# Patient Record
Sex: Female | Born: 1940 | Race: White | Hispanic: No | Marital: Married | Smoking: Never smoker
Health system: Southern US, Community
[De-identification: ages and names within clinical notes are randomized; demographics above are authoritative.]

## PROBLEM LIST (undated history)

## (undated) DIAGNOSIS — M545 Low back pain, unspecified: Secondary | ICD-10-CM

## (undated) DIAGNOSIS — N319 Neuromuscular dysfunction of bladder, unspecified: Secondary | ICD-10-CM

## (undated) DIAGNOSIS — F32A Depression, unspecified: Secondary | ICD-10-CM

## (undated) DIAGNOSIS — G35 Multiple sclerosis: Secondary | ICD-10-CM

## (undated) DIAGNOSIS — F329 Major depressive disorder, single episode, unspecified: Secondary | ICD-10-CM

## (undated) DIAGNOSIS — R269 Unspecified abnormalities of gait and mobility: Secondary | ICD-10-CM

## (undated) DIAGNOSIS — IMO0002 Reserved for concepts with insufficient information to code with codable children: Secondary | ICD-10-CM

## (undated) DIAGNOSIS — M0520 Rheumatoid vasculitis with rheumatoid arthritis of unspecified site: Secondary | ICD-10-CM

## (undated) DIAGNOSIS — F419 Anxiety disorder, unspecified: Secondary | ICD-10-CM

## (undated) DIAGNOSIS — M797 Fibromyalgia: Secondary | ICD-10-CM

## (undated) HISTORY — DX: Unspecified abnormalities of gait and mobility: R26.9

## (undated) HISTORY — DX: Low back pain, unspecified: M54.50

## (undated) HISTORY — DX: Neuromuscular dysfunction of bladder, unspecified: N31.9

## (undated) HISTORY — PX: GALLBLADDER SURGERY: SHX652

## (undated) HISTORY — PX: ABDOMINAL HYSTERECTOMY: SHX81

## (undated) HISTORY — DX: Multiple sclerosis: G35

## (undated) HISTORY — DX: Low back pain: M54.5

## (undated) HISTORY — DX: Anxiety disorder, unspecified: F41.9

## (undated) HISTORY — PX: APPENDECTOMY: SHX54

## (undated) HISTORY — PX: FRACTURE SURGERY: SHX138

## (undated) HISTORY — DX: Major depressive disorder, single episode, unspecified: F32.9

## (undated) HISTORY — PX: VASCULAR SURGERY: SHX849

## (undated) HISTORY — DX: Reserved for concepts with insufficient information to code with codable children: IMO0002

## (undated) HISTORY — DX: Rheumatoid vasculitis with rheumatoid arthritis of unspecified site: M05.20

## (undated) HISTORY — DX: Depression, unspecified: F32.A

## (undated) HISTORY — DX: Fibromyalgia: M79.7

---

## 2005-01-16 ENCOUNTER — Inpatient Hospital Stay (HOSPITAL_COMMUNITY): Admission: AD | Admit: 2005-01-16 | Discharge: 2005-01-17 | Payer: Self-pay | Admitting: Cardiovascular Disease

## 2005-01-16 ENCOUNTER — Ambulatory Visit: Payer: Self-pay | Admitting: Cardiovascular Disease

## 2007-02-23 ENCOUNTER — Ambulatory Visit: Payer: Self-pay | Admitting: Cardiology

## 2011-02-26 DIAGNOSIS — M069 Rheumatoid arthritis, unspecified: Secondary | ICD-10-CM | POA: Diagnosis not present

## 2011-02-26 DIAGNOSIS — M130 Polyarthritis, unspecified: Secondary | ICD-10-CM | POA: Diagnosis not present

## 2011-02-26 DIAGNOSIS — Z79899 Other long term (current) drug therapy: Secondary | ICD-10-CM | POA: Diagnosis not present

## 2011-02-26 DIAGNOSIS — M1710 Unilateral primary osteoarthritis, unspecified knee: Secondary | ICD-10-CM | POA: Diagnosis not present

## 2011-03-05 DIAGNOSIS — Z79899 Other long term (current) drug therapy: Secondary | ICD-10-CM | POA: Diagnosis not present

## 2011-03-05 DIAGNOSIS — M069 Rheumatoid arthritis, unspecified: Secondary | ICD-10-CM | POA: Diagnosis not present

## 2011-03-05 DIAGNOSIS — M130 Polyarthritis, unspecified: Secondary | ICD-10-CM | POA: Diagnosis not present

## 2011-03-05 DIAGNOSIS — IMO0001 Reserved for inherently not codable concepts without codable children: Secondary | ICD-10-CM | POA: Diagnosis not present

## 2011-03-11 DIAGNOSIS — Z7983 Long term (current) use of bisphosphonates: Secondary | ICD-10-CM | POA: Diagnosis not present

## 2011-03-11 DIAGNOSIS — M810 Age-related osteoporosis without current pathological fracture: Secondary | ICD-10-CM | POA: Diagnosis not present

## 2011-03-20 DIAGNOSIS — M25559 Pain in unspecified hip: Secondary | ICD-10-CM | POA: Diagnosis not present

## 2011-03-20 DIAGNOSIS — R0989 Other specified symptoms and signs involving the circulatory and respiratory systems: Secondary | ICD-10-CM | POA: Diagnosis not present

## 2011-03-20 DIAGNOSIS — R05 Cough: Secondary | ICD-10-CM | POA: Diagnosis not present

## 2011-03-20 DIAGNOSIS — J40 Bronchitis, not specified as acute or chronic: Secondary | ICD-10-CM | POA: Diagnosis not present

## 2011-03-20 DIAGNOSIS — J3489 Other specified disorders of nose and nasal sinuses: Secondary | ICD-10-CM | POA: Diagnosis not present

## 2011-04-28 DIAGNOSIS — M069 Rheumatoid arthritis, unspecified: Secondary | ICD-10-CM | POA: Diagnosis not present

## 2011-04-28 DIAGNOSIS — M19049 Primary osteoarthritis, unspecified hand: Secondary | ICD-10-CM | POA: Diagnosis not present

## 2011-04-28 DIAGNOSIS — M1610 Unilateral primary osteoarthritis, unspecified hip: Secondary | ICD-10-CM | POA: Diagnosis not present

## 2011-04-28 DIAGNOSIS — M545 Low back pain: Secondary | ICD-10-CM | POA: Diagnosis not present

## 2011-05-19 DIAGNOSIS — M130 Polyarthritis, unspecified: Secondary | ICD-10-CM | POA: Diagnosis not present

## 2011-05-19 DIAGNOSIS — M2550 Pain in unspecified joint: Secondary | ICD-10-CM | POA: Diagnosis not present

## 2011-05-19 DIAGNOSIS — M2560 Stiffness of unspecified joint, not elsewhere classified: Secondary | ICD-10-CM | POA: Diagnosis not present

## 2011-05-20 DIAGNOSIS — D518 Other vitamin B12 deficiency anemias: Secondary | ICD-10-CM | POA: Insufficient documentation

## 2011-05-20 DIAGNOSIS — G35 Multiple sclerosis: Secondary | ICD-10-CM | POA: Insufficient documentation

## 2011-05-20 DIAGNOSIS — G839 Paralytic syndrome, unspecified: Secondary | ICD-10-CM | POA: Diagnosis not present

## 2011-05-20 DIAGNOSIS — R269 Unspecified abnormalities of gait and mobility: Secondary | ICD-10-CM | POA: Diagnosis not present

## 2011-05-21 DIAGNOSIS — M2550 Pain in unspecified joint: Secondary | ICD-10-CM | POA: Diagnosis not present

## 2011-05-26 DIAGNOSIS — M2550 Pain in unspecified joint: Secondary | ICD-10-CM | POA: Diagnosis not present

## 2011-05-27 DIAGNOSIS — Z79899 Other long term (current) drug therapy: Secondary | ICD-10-CM | POA: Diagnosis not present

## 2011-05-29 DIAGNOSIS — M2550 Pain in unspecified joint: Secondary | ICD-10-CM | POA: Diagnosis not present

## 2011-06-02 DIAGNOSIS — M2550 Pain in unspecified joint: Secondary | ICD-10-CM | POA: Diagnosis not present

## 2011-06-03 DIAGNOSIS — M2550 Pain in unspecified joint: Secondary | ICD-10-CM | POA: Diagnosis not present

## 2011-06-03 DIAGNOSIS — M069 Rheumatoid arthritis, unspecified: Secondary | ICD-10-CM | POA: Diagnosis not present

## 2011-06-03 DIAGNOSIS — IMO0001 Reserved for inherently not codable concepts without codable children: Secondary | ICD-10-CM | POA: Diagnosis not present

## 2011-06-05 DIAGNOSIS — M2550 Pain in unspecified joint: Secondary | ICD-10-CM | POA: Diagnosis not present

## 2011-06-09 DIAGNOSIS — M2550 Pain in unspecified joint: Secondary | ICD-10-CM | POA: Diagnosis not present

## 2011-07-02 DIAGNOSIS — R5381 Other malaise: Secondary | ICD-10-CM | POA: Diagnosis not present

## 2011-07-02 DIAGNOSIS — R300 Dysuria: Secondary | ICD-10-CM | POA: Diagnosis not present

## 2011-07-02 DIAGNOSIS — I10 Essential (primary) hypertension: Secondary | ICD-10-CM | POA: Diagnosis not present

## 2011-07-02 DIAGNOSIS — E782 Mixed hyperlipidemia: Secondary | ICD-10-CM | POA: Diagnosis not present

## 2011-07-09 DIAGNOSIS — E782 Mixed hyperlipidemia: Secondary | ICD-10-CM | POA: Diagnosis not present

## 2011-07-09 DIAGNOSIS — IMO0001 Reserved for inherently not codable concepts without codable children: Secondary | ICD-10-CM | POA: Diagnosis not present

## 2011-07-09 DIAGNOSIS — G4700 Insomnia, unspecified: Secondary | ICD-10-CM | POA: Diagnosis not present

## 2011-07-09 DIAGNOSIS — M810 Age-related osteoporosis without current pathological fracture: Secondary | ICD-10-CM | POA: Diagnosis not present

## 2011-07-09 DIAGNOSIS — M545 Low back pain: Secondary | ICD-10-CM | POA: Diagnosis not present

## 2011-07-09 DIAGNOSIS — M1990 Unspecified osteoarthritis, unspecified site: Secondary | ICD-10-CM | POA: Diagnosis not present

## 2011-07-09 DIAGNOSIS — I10 Essential (primary) hypertension: Secondary | ICD-10-CM | POA: Diagnosis not present

## 2011-07-09 DIAGNOSIS — G35 Multiple sclerosis: Secondary | ICD-10-CM | POA: Diagnosis not present

## 2011-08-11 DIAGNOSIS — H6120 Impacted cerumen, unspecified ear: Secondary | ICD-10-CM | POA: Diagnosis not present

## 2011-08-27 DIAGNOSIS — IMO0001 Reserved for inherently not codable concepts without codable children: Secondary | ICD-10-CM | POA: Diagnosis not present

## 2011-08-27 DIAGNOSIS — M790 Rheumatism, unspecified: Secondary | ICD-10-CM | POA: Diagnosis not present

## 2011-08-27 DIAGNOSIS — I288 Other diseases of pulmonary vessels: Secondary | ICD-10-CM | POA: Diagnosis not present

## 2011-08-27 DIAGNOSIS — D472 Monoclonal gammopathy: Secondary | ICD-10-CM | POA: Diagnosis not present

## 2011-08-27 DIAGNOSIS — M069 Rheumatoid arthritis, unspecified: Secondary | ICD-10-CM | POA: Diagnosis not present

## 2011-09-03 DIAGNOSIS — M069 Rheumatoid arthritis, unspecified: Secondary | ICD-10-CM | POA: Diagnosis not present

## 2011-09-03 DIAGNOSIS — IMO0001 Reserved for inherently not codable concepts without codable children: Secondary | ICD-10-CM | POA: Diagnosis not present

## 2011-09-03 DIAGNOSIS — M2550 Pain in unspecified joint: Secondary | ICD-10-CM | POA: Diagnosis not present

## 2011-09-03 DIAGNOSIS — Z79899 Other long term (current) drug therapy: Secondary | ICD-10-CM | POA: Diagnosis not present

## 2011-10-06 DIAGNOSIS — M545 Low back pain: Secondary | ICD-10-CM | POA: Diagnosis not present

## 2011-10-06 DIAGNOSIS — M1990 Unspecified osteoarthritis, unspecified site: Secondary | ICD-10-CM | POA: Diagnosis not present

## 2011-10-06 DIAGNOSIS — E782 Mixed hyperlipidemia: Secondary | ICD-10-CM | POA: Diagnosis not present

## 2011-10-06 DIAGNOSIS — G4700 Insomnia, unspecified: Secondary | ICD-10-CM | POA: Diagnosis not present

## 2011-10-06 DIAGNOSIS — G35 Multiple sclerosis: Secondary | ICD-10-CM | POA: Diagnosis not present

## 2011-10-06 DIAGNOSIS — IMO0001 Reserved for inherently not codable concepts without codable children: Secondary | ICD-10-CM | POA: Diagnosis not present

## 2011-10-06 DIAGNOSIS — M810 Age-related osteoporosis without current pathological fracture: Secondary | ICD-10-CM | POA: Diagnosis not present

## 2011-10-06 DIAGNOSIS — I10 Essential (primary) hypertension: Secondary | ICD-10-CM | POA: Diagnosis not present

## 2011-11-06 DIAGNOSIS — R894 Abnormal immunological findings in specimens from other organs, systems and tissues: Secondary | ICD-10-CM | POA: Diagnosis not present

## 2011-11-06 DIAGNOSIS — IMO0001 Reserved for inherently not codable concepts without codable children: Secondary | ICD-10-CM | POA: Diagnosis not present

## 2011-11-06 DIAGNOSIS — Z23 Encounter for immunization: Secondary | ICD-10-CM | POA: Diagnosis not present

## 2011-11-06 DIAGNOSIS — G35 Multiple sclerosis: Secondary | ICD-10-CM | POA: Diagnosis not present

## 2011-11-06 DIAGNOSIS — Z79899 Other long term (current) drug therapy: Secondary | ICD-10-CM | POA: Diagnosis not present

## 2011-11-06 DIAGNOSIS — M1990 Unspecified osteoarthritis, unspecified site: Secondary | ICD-10-CM | POA: Diagnosis not present

## 2011-11-06 DIAGNOSIS — D472 Monoclonal gammopathy: Secondary | ICD-10-CM | POA: Diagnosis not present

## 2011-11-21 DIAGNOSIS — IMO0001 Reserved for inherently not codable concepts without codable children: Secondary | ICD-10-CM | POA: Diagnosis not present

## 2011-11-21 DIAGNOSIS — M9981 Other biomechanical lesions of cervical region: Secondary | ICD-10-CM | POA: Diagnosis not present

## 2011-11-21 DIAGNOSIS — M999 Biomechanical lesion, unspecified: Secondary | ICD-10-CM | POA: Diagnosis not present

## 2011-11-25 DIAGNOSIS — G35 Multiple sclerosis: Secondary | ICD-10-CM | POA: Diagnosis not present

## 2011-11-25 DIAGNOSIS — M1990 Unspecified osteoarthritis, unspecified site: Secondary | ICD-10-CM | POA: Diagnosis not present

## 2011-11-25 DIAGNOSIS — G4700 Insomnia, unspecified: Secondary | ICD-10-CM | POA: Diagnosis not present

## 2011-11-25 DIAGNOSIS — M999 Biomechanical lesion, unspecified: Secondary | ICD-10-CM | POA: Diagnosis not present

## 2011-11-25 DIAGNOSIS — M545 Low back pain: Secondary | ICD-10-CM | POA: Diagnosis not present

## 2011-11-25 DIAGNOSIS — I10 Essential (primary) hypertension: Secondary | ICD-10-CM | POA: Diagnosis not present

## 2011-11-25 DIAGNOSIS — E782 Mixed hyperlipidemia: Secondary | ICD-10-CM | POA: Diagnosis not present

## 2011-11-25 DIAGNOSIS — M810 Age-related osteoporosis without current pathological fracture: Secondary | ICD-10-CM | POA: Diagnosis not present

## 2011-11-25 DIAGNOSIS — M9981 Other biomechanical lesions of cervical region: Secondary | ICD-10-CM | POA: Diagnosis not present

## 2011-11-25 DIAGNOSIS — IMO0001 Reserved for inherently not codable concepts without codable children: Secondary | ICD-10-CM | POA: Diagnosis not present

## 2011-11-27 DIAGNOSIS — M9981 Other biomechanical lesions of cervical region: Secondary | ICD-10-CM | POA: Diagnosis not present

## 2011-11-27 DIAGNOSIS — M999 Biomechanical lesion, unspecified: Secondary | ICD-10-CM | POA: Diagnosis not present

## 2011-11-27 DIAGNOSIS — IMO0001 Reserved for inherently not codable concepts without codable children: Secondary | ICD-10-CM | POA: Diagnosis not present

## 2011-11-28 DIAGNOSIS — M999 Biomechanical lesion, unspecified: Secondary | ICD-10-CM | POA: Diagnosis not present

## 2011-11-28 DIAGNOSIS — M9981 Other biomechanical lesions of cervical region: Secondary | ICD-10-CM | POA: Diagnosis not present

## 2011-11-28 DIAGNOSIS — IMO0001 Reserved for inherently not codable concepts without codable children: Secondary | ICD-10-CM | POA: Diagnosis not present

## 2011-12-02 DIAGNOSIS — M9981 Other biomechanical lesions of cervical region: Secondary | ICD-10-CM | POA: Diagnosis not present

## 2011-12-02 DIAGNOSIS — M999 Biomechanical lesion, unspecified: Secondary | ICD-10-CM | POA: Diagnosis not present

## 2011-12-02 DIAGNOSIS — IMO0001 Reserved for inherently not codable concepts without codable children: Secondary | ICD-10-CM | POA: Diagnosis not present

## 2011-12-05 DIAGNOSIS — M9981 Other biomechanical lesions of cervical region: Secondary | ICD-10-CM | POA: Diagnosis not present

## 2011-12-05 DIAGNOSIS — M069 Rheumatoid arthritis, unspecified: Secondary | ICD-10-CM | POA: Diagnosis not present

## 2011-12-05 DIAGNOSIS — IMO0001 Reserved for inherently not codable concepts without codable children: Secondary | ICD-10-CM | POA: Diagnosis not present

## 2011-12-05 DIAGNOSIS — M999 Biomechanical lesion, unspecified: Secondary | ICD-10-CM | POA: Diagnosis not present

## 2011-12-05 DIAGNOSIS — Z79899 Other long term (current) drug therapy: Secondary | ICD-10-CM | POA: Diagnosis not present

## 2011-12-05 DIAGNOSIS — R894 Abnormal immunological findings in specimens from other organs, systems and tissues: Secondary | ICD-10-CM | POA: Diagnosis not present

## 2012-02-12 DIAGNOSIS — M999 Biomechanical lesion, unspecified: Secondary | ICD-10-CM | POA: Diagnosis not present

## 2012-02-12 DIAGNOSIS — M9981 Other biomechanical lesions of cervical region: Secondary | ICD-10-CM | POA: Diagnosis not present

## 2012-02-12 DIAGNOSIS — IMO0001 Reserved for inherently not codable concepts without codable children: Secondary | ICD-10-CM | POA: Diagnosis not present

## 2012-02-16 DIAGNOSIS — M9981 Other biomechanical lesions of cervical region: Secondary | ICD-10-CM | POA: Diagnosis not present

## 2012-02-16 DIAGNOSIS — M999 Biomechanical lesion, unspecified: Secondary | ICD-10-CM | POA: Diagnosis not present

## 2012-02-16 DIAGNOSIS — IMO0001 Reserved for inherently not codable concepts without codable children: Secondary | ICD-10-CM | POA: Diagnosis not present

## 2012-02-20 DIAGNOSIS — M999 Biomechanical lesion, unspecified: Secondary | ICD-10-CM | POA: Diagnosis not present

## 2012-02-20 DIAGNOSIS — IMO0001 Reserved for inherently not codable concepts without codable children: Secondary | ICD-10-CM | POA: Diagnosis not present

## 2012-02-20 DIAGNOSIS — M9981 Other biomechanical lesions of cervical region: Secondary | ICD-10-CM | POA: Diagnosis not present

## 2012-02-23 DIAGNOSIS — M9981 Other biomechanical lesions of cervical region: Secondary | ICD-10-CM | POA: Diagnosis not present

## 2012-02-23 DIAGNOSIS — IMO0001 Reserved for inherently not codable concepts without codable children: Secondary | ICD-10-CM | POA: Diagnosis not present

## 2012-02-23 DIAGNOSIS — M999 Biomechanical lesion, unspecified: Secondary | ICD-10-CM | POA: Diagnosis not present

## 2012-03-01 DIAGNOSIS — H6690 Otitis media, unspecified, unspecified ear: Secondary | ICD-10-CM | POA: Diagnosis not present

## 2012-03-01 DIAGNOSIS — R51 Headache: Secondary | ICD-10-CM | POA: Diagnosis not present

## 2012-03-08 DIAGNOSIS — Z79899 Other long term (current) drug therapy: Secondary | ICD-10-CM | POA: Diagnosis not present

## 2012-03-08 DIAGNOSIS — M069 Rheumatoid arthritis, unspecified: Secondary | ICD-10-CM | POA: Diagnosis not present

## 2012-03-08 DIAGNOSIS — M159 Polyosteoarthritis, unspecified: Secondary | ICD-10-CM | POA: Diagnosis not present

## 2012-03-08 DIAGNOSIS — IMO0001 Reserved for inherently not codable concepts without codable children: Secondary | ICD-10-CM | POA: Diagnosis not present

## 2012-03-16 DIAGNOSIS — E7800 Pure hypercholesterolemia, unspecified: Secondary | ICD-10-CM | POA: Diagnosis not present

## 2012-03-16 DIAGNOSIS — IMO0001 Reserved for inherently not codable concepts without codable children: Secondary | ICD-10-CM | POA: Diagnosis not present

## 2012-03-16 DIAGNOSIS — I10 Essential (primary) hypertension: Secondary | ICD-10-CM | POA: Diagnosis not present

## 2012-03-16 DIAGNOSIS — Z79899 Other long term (current) drug therapy: Secondary | ICD-10-CM | POA: Diagnosis not present

## 2012-03-16 DIAGNOSIS — E039 Hypothyroidism, unspecified: Secondary | ICD-10-CM | POA: Diagnosis not present

## 2012-03-16 DIAGNOSIS — R5381 Other malaise: Secondary | ICD-10-CM | POA: Diagnosis not present

## 2012-03-16 DIAGNOSIS — R5383 Other fatigue: Secondary | ICD-10-CM | POA: Diagnosis not present

## 2012-03-16 DIAGNOSIS — E559 Vitamin D deficiency, unspecified: Secondary | ICD-10-CM | POA: Diagnosis not present

## 2012-03-16 DIAGNOSIS — M069 Rheumatoid arthritis, unspecified: Secondary | ICD-10-CM | POA: Diagnosis not present

## 2012-03-16 DIAGNOSIS — G35 Multiple sclerosis: Secondary | ICD-10-CM | POA: Diagnosis not present

## 2012-03-16 DIAGNOSIS — E782 Mixed hyperlipidemia: Secondary | ICD-10-CM | POA: Diagnosis not present

## 2012-03-22 DIAGNOSIS — I10 Essential (primary) hypertension: Secondary | ICD-10-CM | POA: Diagnosis not present

## 2012-03-22 DIAGNOSIS — H04129 Dry eye syndrome of unspecified lacrimal gland: Secondary | ICD-10-CM | POA: Diagnosis not present

## 2012-03-22 DIAGNOSIS — H2510 Age-related nuclear cataract, unspecified eye: Secondary | ICD-10-CM | POA: Diagnosis not present

## 2012-03-22 DIAGNOSIS — Z79899 Other long term (current) drug therapy: Secondary | ICD-10-CM | POA: Diagnosis not present

## 2012-03-26 ENCOUNTER — Encounter: Payer: Self-pay | Admitting: Neurology

## 2012-03-26 DIAGNOSIS — D518 Other vitamin B12 deficiency anemias: Secondary | ICD-10-CM

## 2012-03-26 DIAGNOSIS — G35D Multiple sclerosis, unspecified: Secondary | ICD-10-CM

## 2012-03-26 DIAGNOSIS — G35 Multiple sclerosis: Secondary | ICD-10-CM

## 2012-03-26 DIAGNOSIS — R269 Unspecified abnormalities of gait and mobility: Secondary | ICD-10-CM

## 2012-03-26 DIAGNOSIS — G839 Paralytic syndrome, unspecified: Secondary | ICD-10-CM

## 2012-04-08 DIAGNOSIS — Z79899 Other long term (current) drug therapy: Secondary | ICD-10-CM | POA: Diagnosis not present

## 2012-04-08 DIAGNOSIS — R12 Heartburn: Secondary | ICD-10-CM | POA: Diagnosis not present

## 2012-04-08 DIAGNOSIS — K5900 Constipation, unspecified: Secondary | ICD-10-CM | POA: Diagnosis not present

## 2012-04-12 DIAGNOSIS — Z882 Allergy status to sulfonamides status: Secondary | ICD-10-CM | POA: Diagnosis not present

## 2012-04-12 DIAGNOSIS — M069 Rheumatoid arthritis, unspecified: Secondary | ICD-10-CM | POA: Diagnosis not present

## 2012-04-12 DIAGNOSIS — Z801 Family history of malignant neoplasm of trachea, bronchus and lung: Secondary | ICD-10-CM | POA: Diagnosis not present

## 2012-04-12 DIAGNOSIS — I10 Essential (primary) hypertension: Secondary | ICD-10-CM | POA: Diagnosis not present

## 2012-04-12 DIAGNOSIS — R12 Heartburn: Secondary | ICD-10-CM | POA: Diagnosis not present

## 2012-04-12 DIAGNOSIS — F329 Major depressive disorder, single episode, unspecified: Secondary | ICD-10-CM | POA: Diagnosis not present

## 2012-04-12 DIAGNOSIS — IMO0001 Reserved for inherently not codable concepts without codable children: Secondary | ICD-10-CM | POA: Diagnosis not present

## 2012-04-12 DIAGNOSIS — Z79899 Other long term (current) drug therapy: Secondary | ICD-10-CM | POA: Diagnosis not present

## 2012-04-12 DIAGNOSIS — K219 Gastro-esophageal reflux disease without esophagitis: Secondary | ICD-10-CM | POA: Diagnosis not present

## 2012-04-12 DIAGNOSIS — Z885 Allergy status to narcotic agent status: Secondary | ICD-10-CM | POA: Diagnosis not present

## 2012-04-12 DIAGNOSIS — G35 Multiple sclerosis: Secondary | ICD-10-CM | POA: Diagnosis not present

## 2012-04-12 DIAGNOSIS — Z8249 Family history of ischemic heart disease and other diseases of the circulatory system: Secondary | ICD-10-CM | POA: Diagnosis not present

## 2012-04-12 DIAGNOSIS — M1990 Unspecified osteoarthritis, unspecified site: Secondary | ICD-10-CM | POA: Diagnosis not present

## 2012-04-12 DIAGNOSIS — E785 Hyperlipidemia, unspecified: Secondary | ICD-10-CM | POA: Diagnosis not present

## 2012-04-12 DIAGNOSIS — Z800 Family history of malignant neoplasm of digestive organs: Secondary | ICD-10-CM | POA: Diagnosis not present

## 2012-04-12 DIAGNOSIS — K5900 Constipation, unspecified: Secondary | ICD-10-CM | POA: Diagnosis not present

## 2012-04-21 DIAGNOSIS — Z1231 Encounter for screening mammogram for malignant neoplasm of breast: Secondary | ICD-10-CM | POA: Diagnosis not present

## 2012-04-21 DIAGNOSIS — Z01419 Encounter for gynecological examination (general) (routine) without abnormal findings: Secondary | ICD-10-CM | POA: Diagnosis not present

## 2012-04-21 DIAGNOSIS — M818 Other osteoporosis without current pathological fracture: Secondary | ICD-10-CM | POA: Diagnosis not present

## 2012-04-22 DIAGNOSIS — G35 Multiple sclerosis: Secondary | ICD-10-CM | POA: Diagnosis not present

## 2012-05-05 ENCOUNTER — Telehealth: Payer: Self-pay | Admitting: Neurology

## 2012-05-05 MED ORDER — PREDNISONE 10 MG PO TABS: ORAL_TABLET | ORAL | Status: DC

## 2012-05-05 NOTE — Telephone Encounter (Signed)
I called patient. She has last been seen through this office about one year ago. The patient indicates that she has a chronic gait disorder, but within the last 2 days, her walking has significantly worsened. The patient has a difficult to ambulate. The patient denies any falls within the last 2 days. The bladder has become more frequent, but she denies any change in bowel function. The patient has some numbness below the knees bilaterally, and she believes that the left leg is worse in the right. I will get her started on a prednisone Dosepak. This patient may need physical therapy in the next week or 2 if the symptoms do not improve. We will try to get a work in revisit within the next one or 2 weeks.

## 2012-05-05 NOTE — Telephone Encounter (Signed)
Patient left message that she is unable to lift her feet, it began about 2 days ago.  She said it's getting worse and she is unable to use her walker.  She would like to come in and see doctor or "another associate,"  She would like some advice.  Her number is [phone].

## 2012-05-06 ENCOUNTER — Telehealth: Payer: Self-pay

## 2012-05-06 NOTE — Telephone Encounter (Signed)
I called the pharmacy. The prescription was not written properly, the patient was to start on 6 tablets a day, tapering by one tablet every 2 days, Basically a 10 mg 12 day prednisone Dosepak.

## 2012-05-06 NOTE — Telephone Encounter (Signed)
Pharmacy sent [place] a fax stating they need clarification on the Prednisone directions.  They may be phoned at [phone] or faxed at [phone].  Thank you.

## 2012-05-07 ENCOUNTER — Ambulatory Visit: Payer: Self-pay | Admitting: Neurology

## 2012-05-19 ENCOUNTER — Encounter: Payer: Self-pay | Admitting: Neurology

## 2012-05-19 ENCOUNTER — Ambulatory Visit (INDEPENDENT_AMBULATORY_CARE_PROVIDER_SITE_OTHER): Payer: Medicare Other | Admitting: Neurology

## 2012-05-19 VITALS — BP 144/77 | HR 70

## 2012-05-19 DIAGNOSIS — R269 Unspecified abnormalities of gait and mobility: Secondary | ICD-10-CM

## 2012-05-19 DIAGNOSIS — G35 Multiple sclerosis: Secondary | ICD-10-CM | POA: Diagnosis not present

## 2012-05-19 DIAGNOSIS — G35D Multiple sclerosis, unspecified: Secondary | ICD-10-CM

## 2012-05-19 MED ORDER — PREDNISONE 5 MG PO TABS: ORAL_TABLET | ORAL | Status: DC

## 2012-05-19 MED ORDER — OMEPRAZOLE 20 MG PO CPDR: 20.0000 mg | DELAYED_RELEASE_CAPSULE | Freq: Every day | ORAL | Status: AC

## 2012-05-19 NOTE — Progress Notes (Signed)
Reason for visit: Multiple sclerosis  Stephanie Perez is an 72 y.o. female  History of present illness:  Stephanie Perez is a 5 year old right-handed white female with a history of multiple sclerosis. 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 The patient however, at the end of March of 2014 has noted a sudden increase in fatigue, difficulty with walking, increase in urinary frequency, and stumbling and falling at home. The patient uses a walker for ambulation. The fatigue has become overwhelming, and she has developed some discomfort into the legs, primarily the thighs bilaterally. The patient last had MRI evaluation of the brain in 2012. The patient returns for an evaluation. The patient has a history of rheumatoid arthritis, and she is on Plaquenil for this. The patient was set up for a trial on Ampyra one year ago, but she never went on the medication.  Past Medical History  Diagnosis Date  . Multiple sclerosis   . Multiple sclerosis   . Spastic paraparesis   . Depression   . Anxiety   . Fibromyalgia   . Rheumatoid arteritis   . Neurogenic bladder     Past Surgical History  Procedure Laterality Date  . Abdominal hysterectomy    . Appendectomy    . Gallbladder surgery N/A   . Fracture surgery      Family History  Problem Relation Age of Onset  . Lung cancer Mother   . Heart disease Father   . Other Father   . Multiple sclerosis Brother     Social history:  reports that she has never smoked. She does not have any smokeless tobacco history on file. She reports that  drinks alcohol. She reports that she does not use illicit drugs.  Allergies:  Allergies  Allergen Reactions  . Bactrim (Sulfamethoxazole W-Trimethoprim)   . Codeine Sulfate   . Lyrica (Pregabalin)   . Zanaflex (Tizanidine Hcl)     Medications:  Current Outpatient Prescriptions on File Prior to Visit  Medication Sig  Dispense Refill  . aspirin 81 MG tablet Take 81 mg by mouth daily.      . clonazePAM (KLONOPIN) 0.5 MG tablet Take 0.5 mg by mouth at bedtime as needed for anxiety.      . diazepam (VALIUM) 2 MG tablet Take 2 mg by mouth 1 day or 1 dose.      . docusate sodium (COLACE) 100 MG capsule Take 100 mg by mouth 3 (three) times daily as needed for constipation.      . DULoxetine (CYMBALTA) 60 MG capsule Take 60 mg by mouth daily.      [name] estrogen, conjugated,-medroxyprogesterone (PREMPRO) 0.625-2.5 MG per tablet Take 1 tablet by mouth daily.      . hydroxychloroquine (PLAQUENIL) 200 MG tablet Take by mouth 2 (two) times daily.      . traMADol (ULTRAM) 50 MG tablet Take 50 mg by mouth every 6 (six) hours as needed for pain.      . valsartan-hydrochlorothiazide (DIOVAN-HCT) 160-12.5 MG per tablet Take 1 tablet by mouth daily.       No current facility-administered medications on file prior to visit.    ROS:  Out of a complete 14 system review of symptoms, the patient complains only of the following symptoms, and all other reviewed systems are negative.  Fatigue Urinary frequency and incontinence Numbness, weakness Depression, anxiety Restless legs  Blood pressure 144/77, pulse 70.  Physical  Exam  General: The patient is alert and cooperative at the time of the examination.  Skin: No significant peripheral edema is noted.   Neurologic Exam  Cranial nerves: Facial symmetry is present. Speech is normal, no aphasia or dysarthria is noted. Extraocular movements are full. Visual fields are full. Pupils are equal, round, and reactive to light. Discs are flat bilaterally.  Motor: The patient has good strength in the upper extremities. With the lower extremities, there is bilateral proximal weakness, with good distal strength of the legs. Increased motor tone is noted in both lower extremities.  Coordination: The patient has good finger-nose-finger bilaterally. The patient is unable to perform  heel-to-shin bilaterally.  Gait and station: The patient has a diplegic, slow gait. The patient uses a walker for ambulation. The patient is Romberg is positive, the patient falls backwards, and she is unable to perform tandem gait. No drift is seen.  Reflexes: Deep tendon reflexes are symmetric.   Assessment/Plan:  One. Multiple sclerosis  2. Gait disorder  The patient has what was felt to be a chronic progressive MS course. The patient however, appears to developed an acute exacerbation suggesting that she may have some features of relapsing-remitting disease. The patient will be given a course of prednisone taking 20 mg daily, tapering by 5 mg each week until off the medication. The patient was given a recent prednisone Dosepak that helped transiently. The patient will be placed back on Ampyra. If this is effective, she will remain on this medication. The patient will have blood work and urinalysis done today to ensure that another process such as a bladder infection have not worsened her clinical condition. The patient will followup in 3 months. At that time, we will consider the possibility of getting her on a medication such as Tecfidera. The patient will be set up for physical therapy for her gait disorder.  Marlan Palau MD 05/19/2012 8:33 PM  [facility] [address] [place], [place] [address]  Phone [phone] Fax [phone]

## 2012-05-20 ENCOUNTER — Encounter: Payer: Self-pay | Admitting: Neurology

## 2012-05-20 DIAGNOSIS — H269 Unspecified cataract: Secondary | ICD-10-CM | POA: Diagnosis not present

## 2012-05-20 DIAGNOSIS — H2510 Age-related nuclear cataract, unspecified eye: Secondary | ICD-10-CM | POA: Diagnosis not present

## 2012-05-20 LAB — COMPREHENSIVE METABOLIC PANEL
Albumin/Globulin Ratio: 2 (ref 1.1–2.5)
Albumin: 3.9 g/dL (ref 3.5–4.8)
Alkaline Phosphatase: 46 IU/L (ref 39–117)
BUN/Creatinine Ratio: 16 (ref 11–26)
BUN: 13 mg/dL (ref 8–27)
GFR calc Af Amer: 85 mL/min/{1.73_m2} (ref >59)
GFR calc non Af Amer: 74 mL/min/{1.73_m2} (ref >59)
Glucose: 83 mg/dL (ref 65–99)
Potassium: 3.9 mmol/L (ref 3.5–5.2)
Total Bilirubin: 0.4 mg/dL (ref 0.0–1.2)
Total Protein: 5.9 g/dL — ABNORMAL LOW (ref 6.0–8.5)

## 2012-05-20 LAB — CBC WITH DIFFERENTIAL/PLATELET
Basophils Absolute: 0 x10E3/uL (ref 0.0–0.2)
Basos: 0 % (ref 0–3)
Eos: 2 % (ref 0–5)
Eosinophils Absolute: 0.2 x10E3/uL (ref 0.0–0.4)
HCT: 45.3 % (ref 34.0–46.6)
Hemoglobin: 14.9 g/dL (ref 11.1–15.9)
Immature Grans (Abs): 0 x10E3/uL (ref 0.0–0.1)
Immature Granulocytes: 0 % (ref 0–2)
Lymphocytes Absolute: 2.4 x10E3/uL (ref 0.7–3.1)
Lymphs: 24 % (ref 14–46)
MCH: 32.7 pg (ref 26.6–33.0)
MCHC: 32.9 g/dL (ref 31.5–35.7)
MCV: 100 fL — ABNORMAL HIGH (ref 79–97)
Monocytes Absolute: 1 x10E3/uL — ABNORMAL HIGH (ref 0.1–0.9)
Monocytes: 10 % (ref 4–12)
Neutrophils Absolute: 6.4 x10E3/uL (ref 1.4–7.0)
Neutrophils Relative %: 64 % (ref 40–74)
RBC: 4.55 x10E6/uL (ref 3.77–5.28)
RDW: 13.1 % (ref 12.3–15.4)
WBC: 10.1 x10E3/uL (ref 3.4–10.8)

## 2012-05-21 DIAGNOSIS — G35 Multiple sclerosis: Secondary | ICD-10-CM | POA: Diagnosis not present

## 2012-05-21 DIAGNOSIS — H2510 Age-related nuclear cataract, unspecified eye: Secondary | ICD-10-CM | POA: Diagnosis not present

## 2012-05-21 DIAGNOSIS — Z961 Presence of intraocular lens: Secondary | ICD-10-CM | POA: Diagnosis not present

## 2012-05-21 DIAGNOSIS — R269 Unspecified abnormalities of gait and mobility: Secondary | ICD-10-CM | POA: Diagnosis not present

## 2012-05-24 ENCOUNTER — Telehealth: Payer: Self-pay | Admitting: *Deleted

## 2012-05-24 ENCOUNTER — Telehealth: Payer: Self-pay | Admitting: Neurology

## 2012-05-24 MED ORDER — CIPROFLOXACIN HCL 250 MG PO TABS: 250.0000 mg | ORAL_TABLET | Freq: Two times a day (BID) | ORAL | Status: DC

## 2012-05-24 NOTE — Telephone Encounter (Signed)
I called patient. The urinalysis revealed evidence of a urinary tract infection. I will place her on Cipro. It is not clear whether this is the etiology of her clinical worsening recently.

## 2012-05-24 NOTE — Progress Notes (Signed)
Quick Note:  I called and spoke with the patient concerning her lab results being normal. ______ 

## 2012-05-24 NOTE — Telephone Encounter (Signed)
I called and spoke with the patient concerning her urinalysis test being normal.

## 2012-06-07 DIAGNOSIS — Z79899 Other long term (current) drug therapy: Secondary | ICD-10-CM | POA: Diagnosis not present

## 2012-06-07 DIAGNOSIS — M1710 Unilateral primary osteoarthritis, unspecified knee: Secondary | ICD-10-CM | POA: Diagnosis not present

## 2012-06-07 DIAGNOSIS — E559 Vitamin D deficiency, unspecified: Secondary | ICD-10-CM | POA: Diagnosis not present

## 2012-06-07 DIAGNOSIS — IMO0001 Reserved for inherently not codable concepts without codable children: Secondary | ICD-10-CM | POA: Diagnosis not present

## 2012-06-07 DIAGNOSIS — M069 Rheumatoid arthritis, unspecified: Secondary | ICD-10-CM | POA: Diagnosis not present

## 2012-06-24 DIAGNOSIS — H269 Unspecified cataract: Secondary | ICD-10-CM | POA: Diagnosis not present

## 2012-06-24 DIAGNOSIS — H2510 Age-related nuclear cataract, unspecified eye: Secondary | ICD-10-CM | POA: Diagnosis not present

## 2012-07-10 DIAGNOSIS — G35 Multiple sclerosis: Secondary | ICD-10-CM | POA: Diagnosis not present

## 2012-07-10 DIAGNOSIS — I10 Essential (primary) hypertension: Secondary | ICD-10-CM | POA: Diagnosis not present

## 2012-07-10 DIAGNOSIS — S0003XA Contusion of scalp, initial encounter: Secondary | ICD-10-CM | POA: Diagnosis not present

## 2012-07-10 DIAGNOSIS — S0990XA Unspecified injury of head, initial encounter: Secondary | ICD-10-CM | POA: Diagnosis not present

## 2012-07-10 DIAGNOSIS — IMO0002 Reserved for concepts with insufficient information to code with codable children: Secondary | ICD-10-CM | POA: Diagnosis not present

## 2012-07-10 DIAGNOSIS — Z79899 Other long term (current) drug therapy: Secondary | ICD-10-CM | POA: Diagnosis not present

## 2012-07-22 DIAGNOSIS — M545 Low back pain: Secondary | ICD-10-CM | POA: Diagnosis not present

## 2012-07-22 DIAGNOSIS — M1990 Unspecified osteoarthritis, unspecified site: Secondary | ICD-10-CM | POA: Diagnosis not present

## 2012-07-22 DIAGNOSIS — G4700 Insomnia, unspecified: Secondary | ICD-10-CM | POA: Diagnosis not present

## 2012-07-22 DIAGNOSIS — G35 Multiple sclerosis: Secondary | ICD-10-CM | POA: Diagnosis not present

## 2012-07-22 DIAGNOSIS — E782 Mixed hyperlipidemia: Secondary | ICD-10-CM | POA: Diagnosis not present

## 2012-07-22 DIAGNOSIS — I10 Essential (primary) hypertension: Secondary | ICD-10-CM | POA: Diagnosis not present

## 2012-07-22 DIAGNOSIS — IMO0001 Reserved for inherently not codable concepts without codable children: Secondary | ICD-10-CM | POA: Diagnosis not present

## 2012-07-22 DIAGNOSIS — M810 Age-related osteoporosis without current pathological fracture: Secondary | ICD-10-CM | POA: Diagnosis not present

## 2012-07-29 DIAGNOSIS — E782 Mixed hyperlipidemia: Secondary | ICD-10-CM | POA: Diagnosis not present

## 2012-07-29 DIAGNOSIS — R5383 Other fatigue: Secondary | ICD-10-CM | POA: Diagnosis not present

## 2012-07-29 DIAGNOSIS — R5381 Other malaise: Secondary | ICD-10-CM | POA: Diagnosis not present

## 2012-07-29 DIAGNOSIS — M069 Rheumatoid arthritis, unspecified: Secondary | ICD-10-CM | POA: Diagnosis not present

## 2012-07-29 DIAGNOSIS — I10 Essential (primary) hypertension: Secondary | ICD-10-CM | POA: Diagnosis not present

## 2012-07-29 DIAGNOSIS — G35 Multiple sclerosis: Secondary | ICD-10-CM | POA: Diagnosis not present

## 2012-07-29 DIAGNOSIS — IMO0001 Reserved for inherently not codable concepts without codable children: Secondary | ICD-10-CM | POA: Diagnosis not present

## 2012-08-06 DIAGNOSIS — M810 Age-related osteoporosis without current pathological fracture: Secondary | ICD-10-CM | POA: Diagnosis not present

## 2012-08-06 DIAGNOSIS — Z7983 Long term (current) use of bisphosphonates: Secondary | ICD-10-CM | POA: Diagnosis not present

## 2012-08-06 DIAGNOSIS — G35 Multiple sclerosis: Secondary | ICD-10-CM | POA: Diagnosis not present

## 2012-08-10 DIAGNOSIS — G35 Multiple sclerosis: Secondary | ICD-10-CM | POA: Diagnosis not present

## 2012-08-10 DIAGNOSIS — R269 Unspecified abnormalities of gait and mobility: Secondary | ICD-10-CM | POA: Diagnosis not present

## 2012-08-27 DIAGNOSIS — G35 Multiple sclerosis: Secondary | ICD-10-CM | POA: Diagnosis not present

## 2012-09-13 DIAGNOSIS — D2360 Other benign neoplasm of skin of unspecified upper limb, including shoulder: Secondary | ICD-10-CM | POA: Diagnosis not present

## 2012-09-13 DIAGNOSIS — D235 Other benign neoplasm of skin of trunk: Secondary | ICD-10-CM | POA: Diagnosis not present

## 2012-09-13 DIAGNOSIS — R21 Rash and other nonspecific skin eruption: Secondary | ICD-10-CM | POA: Diagnosis not present

## 2012-09-13 DIAGNOSIS — D2330 Other benign neoplasm of skin of unspecified part of face: Secondary | ICD-10-CM | POA: Diagnosis not present

## 2012-10-04 DIAGNOSIS — M79609 Pain in unspecified limb: Secondary | ICD-10-CM | POA: Diagnosis not present

## 2012-10-04 DIAGNOSIS — M25579 Pain in unspecified ankle and joints of unspecified foot: Secondary | ICD-10-CM | POA: Diagnosis not present

## 2012-10-05 DIAGNOSIS — M79609 Pain in unspecified limb: Secondary | ICD-10-CM | POA: Diagnosis not present

## 2012-10-05 DIAGNOSIS — R609 Edema, unspecified: Secondary | ICD-10-CM | POA: Diagnosis not present

## 2012-10-07 DIAGNOSIS — L723 Sebaceous cyst: Secondary | ICD-10-CM | POA: Diagnosis not present

## 2012-10-08 ENCOUNTER — Ambulatory Visit: Payer: Medicare Other | Admitting: Neurology

## 2012-10-13 DIAGNOSIS — IMO0001 Reserved for inherently not codable concepts without codable children: Secondary | ICD-10-CM | POA: Diagnosis not present

## 2012-10-13 DIAGNOSIS — Z79899 Other long term (current) drug therapy: Secondary | ICD-10-CM | POA: Diagnosis not present

## 2012-10-13 DIAGNOSIS — E7529 Other sphingolipidosis: Secondary | ICD-10-CM | POA: Diagnosis not present

## 2012-10-13 DIAGNOSIS — IMO0002 Reserved for concepts with insufficient information to code with codable children: Secondary | ICD-10-CM | POA: Diagnosis not present

## 2012-10-13 DIAGNOSIS — G603 Idiopathic progressive neuropathy: Secondary | ICD-10-CM | POA: Diagnosis not present

## 2012-10-13 DIAGNOSIS — Z5181 Encounter for therapeutic drug level monitoring: Secondary | ICD-10-CM | POA: Diagnosis not present

## 2012-11-09 DIAGNOSIS — M47817 Spondylosis without myelopathy or radiculopathy, lumbosacral region: Secondary | ICD-10-CM | POA: Diagnosis not present

## 2012-11-11 DIAGNOSIS — Z0000 Encounter for general adult medical examination without abnormal findings: Secondary | ICD-10-CM | POA: Diagnosis not present

## 2012-11-11 DIAGNOSIS — M1990 Unspecified osteoarthritis, unspecified site: Secondary | ICD-10-CM | POA: Diagnosis not present

## 2012-11-11 DIAGNOSIS — R894 Abnormal immunological findings in specimens from other organs, systems and tissues: Secondary | ICD-10-CM | POA: Diagnosis not present

## 2012-11-11 DIAGNOSIS — R6889 Other general symptoms and signs: Secondary | ICD-10-CM | POA: Diagnosis not present

## 2012-11-11 DIAGNOSIS — M5137 Other intervertebral disc degeneration, lumbosacral region: Secondary | ICD-10-CM | POA: Diagnosis not present

## 2012-11-11 DIAGNOSIS — Z23 Encounter for immunization: Secondary | ICD-10-CM | POA: Diagnosis not present

## 2012-11-11 DIAGNOSIS — IMO0001 Reserved for inherently not codable concepts without codable children: Secondary | ICD-10-CM | POA: Diagnosis not present

## 2012-11-22 DIAGNOSIS — M47817 Spondylosis without myelopathy or radiculopathy, lumbosacral region: Secondary | ICD-10-CM | POA: Diagnosis not present

## 2012-11-22 DIAGNOSIS — M461 Sacroiliitis, not elsewhere classified: Secondary | ICD-10-CM | POA: Diagnosis not present

## 2012-11-22 DIAGNOSIS — IMO0002 Reserved for concepts with insufficient information to code with codable children: Secondary | ICD-10-CM | POA: Diagnosis not present

## 2012-11-22 DIAGNOSIS — G35 Multiple sclerosis: Secondary | ICD-10-CM | POA: Diagnosis not present

## 2012-12-01 DIAGNOSIS — I10 Essential (primary) hypertension: Secondary | ICD-10-CM | POA: Diagnosis not present

## 2012-12-01 DIAGNOSIS — E782 Mixed hyperlipidemia: Secondary | ICD-10-CM | POA: Diagnosis not present

## 2012-12-08 ENCOUNTER — Encounter (INDEPENDENT_AMBULATORY_CARE_PROVIDER_SITE_OTHER): Payer: Self-pay

## 2012-12-08 ENCOUNTER — Ambulatory Visit (INDEPENDENT_AMBULATORY_CARE_PROVIDER_SITE_OTHER): Payer: Medicare Other | Admitting: Neurology

## 2012-12-08 ENCOUNTER — Encounter: Payer: Self-pay | Admitting: Neurology

## 2012-12-08 VITALS — BP 143/72 | HR 74 | Wt 162.0 lb

## 2012-12-08 DIAGNOSIS — G35 Multiple sclerosis: Secondary | ICD-10-CM | POA: Diagnosis not present

## 2012-12-08 DIAGNOSIS — N319 Neuromuscular dysfunction of bladder, unspecified: Secondary | ICD-10-CM | POA: Diagnosis not present

## 2012-12-08 DIAGNOSIS — R269 Unspecified abnormalities of gait and mobility: Secondary | ICD-10-CM

## 2012-12-08 MED ORDER — OXYBUTYNIN CHLORIDE 5 MG PO TABS: 5.0000 mg | ORAL_TABLET | Freq: Two times a day (BID) | ORAL | Status: DC

## 2012-12-08 MED ORDER — DIAZEPAM 2 MG PO TABS: 2.0000 mg | ORAL_TABLET | Freq: Three times a day (TID) | ORAL | Status: DC | PRN

## 2012-12-08 MED ORDER — PREDNISONE 10 MG PO TABS: ORAL_TABLET | ORAL | Status: DC

## 2012-12-08 NOTE — Progress Notes (Addendum)
Reason for visit: Multiple sclerosis  Stephanie Perez is an 72 y.o. female  History of present illness:  Stephanie Perez is a 14 year old right-handed white female with a history of multiple sclerosis associated with a gait disorder. Over the last several months, the patient has had ongoing progression of her ability to ambulate. The patient has not been able to walk essentially over the last 8 weeks. The patient has developed some low back pain, and bilateral lower extremity pain. MRI of the lumbosacral spine was done, and this was brought for my review. This shows some minor disc bulges at the L1-2 level, and the L5-S1 levels without evidence of nerve root compression or significant spinal stenosis. Facet joint injections and SI joint injections have helped her. The patient is able to stand independently, but when she tries to walk on her feet do not move. The patient has had worsening of her bladder function as well, with increased urinary frequency, urgency, and incontinence. The patient has had no changes in speech, swallowing, vision, or arm function. The patient takes Ultram if needed for pain. The patient never went on Ampyra secondary to cost issues. The patient has fallen on occasion at home.  Past Medical History  Diagnosis Date  . Multiple sclerosis   . Spastic paraparesis   . Depression   . Anxiety   . Fibromyalgia   . Rheumatoid arteritis   . Neurogenic bladder   . Abnormality of gait   . Lumbago     Past Surgical History  Procedure Laterality Date  . Abdominal hysterectomy    . Appendectomy    . Gallbladder surgery N/A   . Fracture surgery      Family History  Problem Relation Age of Onset  . Lung cancer Mother   . Heart disease Father   . Other Father   . Multiple sclerosis Brother     Social history:  reports that she has never smoked. She has never used smokeless tobacco. She reports that she drinks alcohol. She reports that she does not use illicit drugs.     Allergies  Allergen Reactions  . Bactrim [Sulfamethoxazole-Trimethoprim]   . Codeine Sulfate   . Lyrica [Pregabalin]   . Zanaflex [Tizanidine Hcl]     Medications:  Current Outpatient Prescriptions on File Prior to Visit  Medication Sig Dispense Refill  . aspirin 81 MG tablet Take 81 mg by mouth daily.      . clonazePAM (KLONOPIN) 0.5 MG tablet Take 0.5 mg by mouth at bedtime as needed for anxiety.      . docusate sodium (COLACE) 100 MG capsule Take 100 mg by mouth 3 (three) times daily as needed for constipation.      . DULoxetine (CYMBALTA) 60 MG capsule Take 60 mg by mouth daily.      [name] estrogen, conjugated,-medroxyprogesterone (PREMPRO) 0.625-2.5 MG per tablet Take 1 tablet by mouth daily.      . hydroxychloroquine (PLAQUENIL) 200 MG tablet Take by mouth 2 (two) times daily.      [name] omeprazole (PRILOSEC) 20 MG capsule Take 1 capsule (20 mg total) by mouth daily.      . traMADol (ULTRAM) 50 MG tablet Take 50 mg by mouth every 6 (six) hours as needed for pain.       No current facility-administered medications on file prior to visit.    ROS:  Out of a complete 14 system review of symptoms, the patient complains only of the following symptoms, and all other  reviewed systems are negative.  Fatigue Constipation Urination problems, urinary incontinence Joint pain, back pain, achy muscles Depression, anxiety, decreased energy, occasional suicidal ideation  Blood pressure 143/72, pulse 74, weight 162 lb (73.483 kg).  Physical Exam  General: The patient is alert and cooperative at the time of the examination.  Skin: No significant peripheral edema is noted.   Neurologic Exam  Mental status: The patient is oriented x 3.  Cranial nerves: Facial symmetry is present. Speech is normal, no aphasia or dysarthria is noted. Extraocular movements are full. Visual fields are full. Pupils are equal, round, and reactive to light. Discs are flat bilaterally.  Motor: The patient has good  strength in  the upper extremities.The patient has 3/5 strength proximally in the legs. The patient has 4/5 strength with knee extension, and dorsiflexion of the feet bilaterally. Some increased motor tone is noted in the legs.  Sensory examination: Soft touch sensation is symmetric on the face, arms, and legs.  Coordination: The patient has good finger-nose-finger bilaterally, but the patient has difficulty performing heel-to-shin on both sides.  Gait and station: The patient  is able to pull herself up to a standing position from a wheelchair, but she is unable to move the legs to ambulate. Tandem gait cannot be performed. Romberg is negative. No drift is seen.  Reflexes: Deep tendon reflexes are symmetric.   Assessment/Plan:  1. Multiple sclerosis  2. Gait disorder  3. Neurogenic bladder  The patient has had a decline in her ability to ambulate. The patient will be placed on a three-day course of Solu-Medrol, and then she will start a prednisone Dosepak. The patient will undergo physical therapy in the home environment. The patient was given a prescription for oxybutynin for her bladder. The patient followup in 3-4 months.  Marlan Palau MD 12/08/2012 8:48 PM  [facility] [address] [place], [place] [address]  Phone [phone] Fax [phone]

## 2012-12-08 NOTE — Patient Instructions (Signed)
Multiple Sclerosis Multiple sclerosis (MS) is a disease of the central nervous system. Its cause is unknown. It is more common in the northern states than in the southern states. There is a higher incidence of MS in women. There is a wide variation in the symptoms (problems) of MS. This is because of the many different ways it affects the central nervous system. It often comes on in episodes or attacks. These attacks may last weeks to months. There may be long periods of nearly no problems between attacks. The main symptoms include visual problems (associated with eye pain), numbness, weakness, and paralysis in extremities (arms/hands and legs/feet). There may also be tremors and problems with balance and walking. The age when MS starts is variable. Advances in medicine continue to improve the treatment of this illness. There is no known cure for MS but there are medications that help. MS is not an inherited illness, although your risk of getting this disease is higher if you have a relative with MS. The best radiologic (x-ray) study for MS is an MRI (magnetic resonance imaging). There are medications available to decrease the number and frequency of attacks. SYMPTOMS  The symptoms of MS are caused by loss of insulation (myelin) of the nerves of the brain. When this happens, brain signals do not get transmitted properly or may not get transmitted at all. Some of the problems caused by this include:   Numbness.  Weakness.  Paralysis in extremities.  Visual problems, eye pain.  Balance problems.  Tremors. DIAGNOSIS  Your caregiver can do studies on you to make this diagnosis. This may include specialized X-rays and spinal fluid studies. HOME CARE INSTRUCTIONS   Take medications as directed by your caregiver. Baclofen is a drug commonly used to reduce muscle spasticity. Steroids are often used for short term relief.  Exercise as directed.  Use physical and occupational therapy as directed by  your caregiver. Careful attention to this medical care can help avoid depression.  See your caregiver if you begin to have problems with depression. This is a common problem in MS. Patients often continue to work many years after the diagnosis of MS. Document Released: 01/25/2000 Document Revised: 04/21/2011 Document Reviewed: 09/02/2006 [employer] Patient Information 2014 [employer].  

## 2012-12-10 ENCOUNTER — Telehealth: Payer: Self-pay | Admitting: *Deleted

## 2012-12-10 NOTE — Telephone Encounter (Signed)
I called and spoke to [facility] about IV solumedrol 500mg  x 3 days.  Also needing HH PT.

## 2012-12-14 ENCOUNTER — Telehealth: Payer: Self-pay | Admitting: Neurology

## 2012-12-14 NOTE — Telephone Encounter (Signed)
I recalled [facility] and had to refax the order for IV solumedrol to them again tonight.

## 2012-12-15 NOTE — Telephone Encounter (Signed)
I called and pt has not been contacted by anyone until [facility] called her this am.  Stephanie Perez with [facility] will continue to try and find nursing.  I called and spoke to Dr. Garner Nash office in regards who they use.

## 2012-12-16 DIAGNOSIS — Z1331 Encounter for screening for depression: Secondary | ICD-10-CM | POA: Diagnosis not present

## 2012-12-16 DIAGNOSIS — E782 Mixed hyperlipidemia: Secondary | ICD-10-CM | POA: Diagnosis not present

## 2012-12-16 DIAGNOSIS — I10 Essential (primary) hypertension: Secondary | ICD-10-CM | POA: Diagnosis not present

## 2012-12-16 DIAGNOSIS — G35 Multiple sclerosis: Secondary | ICD-10-CM | POA: Diagnosis not present

## 2012-12-16 DIAGNOSIS — M069 Rheumatoid arthritis, unspecified: Secondary | ICD-10-CM | POA: Diagnosis not present

## 2012-12-16 DIAGNOSIS — R5381 Other malaise: Secondary | ICD-10-CM | POA: Diagnosis not present

## 2012-12-16 DIAGNOSIS — IMO0001 Reserved for inherently not codable concepts without codable children: Secondary | ICD-10-CM | POA: Diagnosis not present

## 2012-12-16 NOTE — Telephone Encounter (Signed)
I called and spoke to [facility] with Dr. Almond Lint.  Faxed to [place], would not go thru. (twice).  They were having staff meeting.  I called and spoke to [place] and repeat fax # same.  Will fax again.

## 2012-12-17 ENCOUNTER — Telehealth: Payer: Self-pay | Admitting: *Deleted

## 2012-12-17 NOTE — Telephone Encounter (Signed)
Message copied by Hermenia Fiscal on Fri Dec 17, 2012  2:39 PM ------      Message from: Theador Hawthorne      Created: Fri Dec 17, 2012  2:13 PM      Regarding: Home health and solumedrol       Rec'd order for nursing, physical therapy and solumedrol.  [facility] does not service the [place], [place] area for home health or solumedrol.            ~Betsy ------

## 2012-12-20 DIAGNOSIS — G35 Multiple sclerosis: Secondary | ICD-10-CM | POA: Diagnosis not present

## 2012-12-21 DIAGNOSIS — G35 Multiple sclerosis: Secondary | ICD-10-CM | POA: Diagnosis not present

## 2012-12-22 DIAGNOSIS — G35 Multiple sclerosis: Secondary | ICD-10-CM | POA: Diagnosis not present

## 2012-12-31 NOTE — Telephone Encounter (Signed)
I spoke to pt.  She will go to [facility] for IV solumedrol infusions (as outpt).  Will have PT thru [place] [facility]. (per referral coordinator [place]).

## 2013-01-05 DIAGNOSIS — G63 Polyneuropathy in diseases classified elsewhere: Secondary | ICD-10-CM | POA: Diagnosis not present

## 2013-01-05 DIAGNOSIS — M461 Sacroiliitis, not elsewhere classified: Secondary | ICD-10-CM | POA: Diagnosis not present

## 2013-01-05 DIAGNOSIS — M47817 Spondylosis without myelopathy or radiculopathy, lumbosacral region: Secondary | ICD-10-CM | POA: Diagnosis not present

## 2013-01-05 DIAGNOSIS — G35 Multiple sclerosis: Secondary | ICD-10-CM | POA: Diagnosis not present

## 2013-01-11 DIAGNOSIS — R269 Unspecified abnormalities of gait and mobility: Secondary | ICD-10-CM | POA: Diagnosis not present

## 2013-01-13 DIAGNOSIS — M25559 Pain in unspecified hip: Secondary | ICD-10-CM | POA: Diagnosis not present

## 2013-01-13 DIAGNOSIS — M549 Dorsalgia, unspecified: Secondary | ICD-10-CM | POA: Diagnosis not present

## 2013-01-13 DIAGNOSIS — W19XXXA Unspecified fall, initial encounter: Secondary | ICD-10-CM | POA: Diagnosis not present

## 2013-01-26 DIAGNOSIS — IMO0001 Reserved for inherently not codable concepts without codable children: Secondary | ICD-10-CM | POA: Diagnosis not present

## 2013-01-26 DIAGNOSIS — G603 Idiopathic progressive neuropathy: Secondary | ICD-10-CM | POA: Diagnosis not present

## 2013-01-26 DIAGNOSIS — M461 Sacroiliitis, not elsewhere classified: Secondary | ICD-10-CM | POA: Diagnosis not present

## 2013-01-26 DIAGNOSIS — IMO0002 Reserved for concepts with insufficient information to code with codable children: Secondary | ICD-10-CM | POA: Diagnosis not present

## 2013-02-14 ENCOUNTER — Other Ambulatory Visit: Payer: Self-pay | Admitting: *Deleted

## 2013-02-14 DIAGNOSIS — IMO0001 Reserved for inherently not codable concepts without codable children: Secondary | ICD-10-CM | POA: Diagnosis not present

## 2013-02-14 DIAGNOSIS — M9981 Other biomechanical lesions of cervical region: Secondary | ICD-10-CM | POA: Diagnosis not present

## 2013-02-14 DIAGNOSIS — M999 Biomechanical lesion, unspecified: Secondary | ICD-10-CM | POA: Diagnosis not present

## 2013-02-14 MED ORDER — DIAZEPAM 2 MG PO TABS: 2.0000 mg | ORAL_TABLET | Freq: Three times a day (TID) | ORAL | Status: DC | PRN

## 2013-02-15 ENCOUNTER — Telehealth: Payer: Self-pay | Admitting: Neurology

## 2013-02-15 NOTE — Telephone Encounter (Signed)
Stephanie Perez w/[facility] called for PT.  PT has appt on 06/10/2013 and they would like to know if a mobility assessment can be added to the appointment.  Please call PT to advise.  Thank you

## 2013-02-16 NOTE — Telephone Encounter (Signed)
I called patient. The patient is not sure that she wants to get a sooner or a power chair yet, but if she does, the revisit in May of 2015 can double as a mobility assessment visit. She will let her physical therapist know this.

## 2013-02-17 DIAGNOSIS — IMO0001 Reserved for inherently not codable concepts without codable children: Secondary | ICD-10-CM | POA: Diagnosis not present

## 2013-02-17 DIAGNOSIS — M999 Biomechanical lesion, unspecified: Secondary | ICD-10-CM | POA: Diagnosis not present

## 2013-02-17 DIAGNOSIS — M9981 Other biomechanical lesions of cervical region: Secondary | ICD-10-CM | POA: Diagnosis not present

## 2013-02-21 DIAGNOSIS — M9981 Other biomechanical lesions of cervical region: Secondary | ICD-10-CM | POA: Diagnosis not present

## 2013-02-21 DIAGNOSIS — M999 Biomechanical lesion, unspecified: Secondary | ICD-10-CM | POA: Diagnosis not present

## 2013-02-21 DIAGNOSIS — IMO0001 Reserved for inherently not codable concepts without codable children: Secondary | ICD-10-CM | POA: Diagnosis not present

## 2013-02-24 DIAGNOSIS — M999 Biomechanical lesion, unspecified: Secondary | ICD-10-CM | POA: Diagnosis not present

## 2013-02-24 DIAGNOSIS — IMO0001 Reserved for inherently not codable concepts without codable children: Secondary | ICD-10-CM | POA: Diagnosis not present

## 2013-02-24 DIAGNOSIS — M9981 Other biomechanical lesions of cervical region: Secondary | ICD-10-CM | POA: Diagnosis not present

## 2013-02-28 DIAGNOSIS — M999 Biomechanical lesion, unspecified: Secondary | ICD-10-CM | POA: Diagnosis not present

## 2013-02-28 DIAGNOSIS — IMO0001 Reserved for inherently not codable concepts without codable children: Secondary | ICD-10-CM | POA: Diagnosis not present

## 2013-02-28 DIAGNOSIS — M9981 Other biomechanical lesions of cervical region: Secondary | ICD-10-CM | POA: Diagnosis not present

## 2013-03-03 DIAGNOSIS — M999 Biomechanical lesion, unspecified: Secondary | ICD-10-CM | POA: Diagnosis not present

## 2013-03-03 DIAGNOSIS — IMO0001 Reserved for inherently not codable concepts without codable children: Secondary | ICD-10-CM | POA: Diagnosis not present

## 2013-03-03 DIAGNOSIS — M9981 Other biomechanical lesions of cervical region: Secondary | ICD-10-CM | POA: Diagnosis not present

## 2013-03-17 DIAGNOSIS — R079 Chest pain, unspecified: Secondary | ICD-10-CM | POA: Diagnosis not present

## 2013-03-17 DIAGNOSIS — G9389 Other specified disorders of brain: Secondary | ICD-10-CM | POA: Diagnosis not present

## 2013-03-17 DIAGNOSIS — Z882 Allergy status to sulfonamides status: Secondary | ICD-10-CM | POA: Diagnosis not present

## 2013-03-17 DIAGNOSIS — R319 Hematuria, unspecified: Secondary | ICD-10-CM | POA: Diagnosis not present

## 2013-03-17 DIAGNOSIS — R0989 Other specified symptoms and signs involving the circulatory and respiratory systems: Secondary | ICD-10-CM | POA: Diagnosis not present

## 2013-03-17 DIAGNOSIS — F3289 Other specified depressive episodes: Secondary | ICD-10-CM | POA: Diagnosis not present

## 2013-03-17 DIAGNOSIS — G894 Chronic pain syndrome: Secondary | ICD-10-CM | POA: Diagnosis not present

## 2013-03-17 DIAGNOSIS — IMO0001 Reserved for inherently not codable concepts without codable children: Secondary | ICD-10-CM | POA: Diagnosis not present

## 2013-03-17 DIAGNOSIS — Z79899 Other long term (current) drug therapy: Secondary | ICD-10-CM | POA: Diagnosis not present

## 2013-03-17 DIAGNOSIS — I10 Essential (primary) hypertension: Secondary | ICD-10-CM | POA: Diagnosis not present

## 2013-03-17 DIAGNOSIS — F411 Generalized anxiety disorder: Secondary | ICD-10-CM | POA: Diagnosis not present

## 2013-03-17 DIAGNOSIS — Z885 Allergy status to narcotic agent status: Secondary | ICD-10-CM | POA: Diagnosis not present

## 2013-03-17 DIAGNOSIS — F329 Major depressive disorder, single episode, unspecified: Secondary | ICD-10-CM | POA: Diagnosis not present

## 2013-03-17 DIAGNOSIS — R51 Headache: Secondary | ICD-10-CM | POA: Diagnosis not present

## 2013-03-17 DIAGNOSIS — Z8249 Family history of ischemic heart disease and other diseases of the circulatory system: Secondary | ICD-10-CM | POA: Diagnosis not present

## 2013-03-17 DIAGNOSIS — G35 Multiple sclerosis: Secondary | ICD-10-CM | POA: Diagnosis not present

## 2013-03-18 DIAGNOSIS — R079 Chest pain, unspecified: Secondary | ICD-10-CM | POA: Diagnosis not present

## 2013-03-18 DIAGNOSIS — R51 Headache: Secondary | ICD-10-CM | POA: Diagnosis not present

## 2013-03-18 DIAGNOSIS — I10 Essential (primary) hypertension: Secondary | ICD-10-CM | POA: Diagnosis not present

## 2013-03-22 DIAGNOSIS — N390 Urinary tract infection, site not specified: Secondary | ICD-10-CM | POA: Diagnosis not present

## 2013-03-22 DIAGNOSIS — R300 Dysuria: Secondary | ICD-10-CM | POA: Diagnosis not present

## 2013-03-25 DIAGNOSIS — R079 Chest pain, unspecified: Secondary | ICD-10-CM | POA: Diagnosis not present

## 2013-04-06 DIAGNOSIS — L259 Unspecified contact dermatitis, unspecified cause: Secondary | ICD-10-CM | POA: Diagnosis not present

## 2013-04-06 DIAGNOSIS — L723 Sebaceous cyst: Secondary | ICD-10-CM | POA: Diagnosis not present

## 2013-04-11 DIAGNOSIS — R079 Chest pain, unspecified: Secondary | ICD-10-CM | POA: Diagnosis not present

## 2013-04-11 DIAGNOSIS — R0789 Other chest pain: Secondary | ICD-10-CM | POA: Diagnosis not present

## 2013-04-11 DIAGNOSIS — I10 Essential (primary) hypertension: Secondary | ICD-10-CM | POA: Diagnosis not present

## 2013-04-12 ENCOUNTER — Telehealth: Payer: Self-pay | Admitting: *Deleted

## 2013-04-12 NOTE — Telephone Encounter (Signed)
I called patient. The patient indicates that she has some Wellbutrin tablets that she was not taking, but she feels more depressed, and wonders if she can get on the medication. Her primary Dr. indicated that it was okay to take with Cymbalta. Certainly, you need to be cautious with combining different antidepressant medications, particularly when she is also using Ultram. If the patient begins to feel tremulous, anxious, with increased blood pressure, she may need to come off of the Wellbutrin. I discussed this with the patient.

## 2013-04-12 NOTE — Telephone Encounter (Signed)
Patient is calling wondering if Dr. Jannifer Franklin would prescribe her Wellbrutrin and Cymbalta at the same time. Patient stated that she would take Wellbrutrin in the daytime and cymbalta at night. Patient stated that Dr. Jannifer Franklin prescribe Wellbrutrin in the past. Patient would like a call back. Please advise

## 2013-04-17 DIAGNOSIS — R300 Dysuria: Secondary | ICD-10-CM | POA: Diagnosis not present

## 2013-04-17 DIAGNOSIS — R072 Precordial pain: Secondary | ICD-10-CM | POA: Diagnosis not present

## 2013-04-21 DIAGNOSIS — G603 Idiopathic progressive neuropathy: Secondary | ICD-10-CM | POA: Diagnosis not present

## 2013-04-21 DIAGNOSIS — G63 Polyneuropathy in diseases classified elsewhere: Secondary | ICD-10-CM | POA: Diagnosis not present

## 2013-04-21 DIAGNOSIS — G35 Multiple sclerosis: Secondary | ICD-10-CM | POA: Diagnosis not present

## 2013-04-21 DIAGNOSIS — M47817 Spondylosis without myelopathy or radiculopathy, lumbosacral region: Secondary | ICD-10-CM | POA: Diagnosis not present

## 2013-04-26 DIAGNOSIS — H01029 Squamous blepharitis unspecified eye, unspecified eyelid: Secondary | ICD-10-CM | POA: Diagnosis not present

## 2013-04-26 DIAGNOSIS — I10 Essential (primary) hypertension: Secondary | ICD-10-CM | POA: Diagnosis not present

## 2013-04-26 DIAGNOSIS — H538 Other visual disturbances: Secondary | ICD-10-CM | POA: Diagnosis not present

## 2013-04-26 DIAGNOSIS — M3500 Sicca syndrome, unspecified: Secondary | ICD-10-CM | POA: Diagnosis not present

## 2013-04-27 DIAGNOSIS — Z1331 Encounter for screening for depression: Secondary | ICD-10-CM | POA: Diagnosis not present

## 2013-04-27 DIAGNOSIS — R5383 Other fatigue: Secondary | ICD-10-CM | POA: Diagnosis not present

## 2013-04-27 DIAGNOSIS — G35 Multiple sclerosis: Secondary | ICD-10-CM | POA: Diagnosis not present

## 2013-04-27 DIAGNOSIS — M069 Rheumatoid arthritis, unspecified: Secondary | ICD-10-CM | POA: Diagnosis not present

## 2013-04-27 DIAGNOSIS — E782 Mixed hyperlipidemia: Secondary | ICD-10-CM | POA: Diagnosis not present

## 2013-04-27 DIAGNOSIS — R5381 Other malaise: Secondary | ICD-10-CM | POA: Diagnosis not present

## 2013-04-27 DIAGNOSIS — I10 Essential (primary) hypertension: Secondary | ICD-10-CM | POA: Diagnosis not present

## 2013-04-27 DIAGNOSIS — IMO0001 Reserved for inherently not codable concepts without codable children: Secondary | ICD-10-CM | POA: Diagnosis not present

## 2013-04-28 DIAGNOSIS — H902 Conductive hearing loss, unspecified: Secondary | ICD-10-CM | POA: Diagnosis not present

## 2013-04-28 DIAGNOSIS — F0280 Dementia in other diseases classified elsewhere without behavioral disturbance: Secondary | ICD-10-CM | POA: Diagnosis not present

## 2013-04-28 DIAGNOSIS — H6120 Impacted cerumen, unspecified ear: Secondary | ICD-10-CM | POA: Diagnosis not present

## 2013-05-03 DIAGNOSIS — E782 Mixed hyperlipidemia: Secondary | ICD-10-CM | POA: Diagnosis not present

## 2013-05-03 DIAGNOSIS — I10 Essential (primary) hypertension: Secondary | ICD-10-CM | POA: Diagnosis not present

## 2013-05-03 DIAGNOSIS — R5383 Other fatigue: Secondary | ICD-10-CM | POA: Diagnosis not present

## 2013-05-03 DIAGNOSIS — R5381 Other malaise: Secondary | ICD-10-CM | POA: Diagnosis not present

## 2013-05-05 DIAGNOSIS — M169 Osteoarthritis of hip, unspecified: Secondary | ICD-10-CM | POA: Diagnosis not present

## 2013-05-05 DIAGNOSIS — M1610 Unilateral primary osteoarthritis, unspecified hip: Secondary | ICD-10-CM | POA: Diagnosis not present

## 2013-05-09 DIAGNOSIS — M461 Sacroiliitis, not elsewhere classified: Secondary | ICD-10-CM | POA: Diagnosis not present

## 2013-05-10 DIAGNOSIS — G35 Multiple sclerosis: Secondary | ICD-10-CM | POA: Diagnosis not present

## 2013-05-10 DIAGNOSIS — R5381 Other malaise: Secondary | ICD-10-CM | POA: Diagnosis not present

## 2013-05-10 DIAGNOSIS — R5383 Other fatigue: Secondary | ICD-10-CM | POA: Diagnosis not present

## 2013-05-10 DIAGNOSIS — IMO0001 Reserved for inherently not codable concepts without codable children: Secondary | ICD-10-CM | POA: Diagnosis not present

## 2013-05-10 DIAGNOSIS — M069 Rheumatoid arthritis, unspecified: Secondary | ICD-10-CM | POA: Diagnosis not present

## 2013-05-10 DIAGNOSIS — Z1331 Encounter for screening for depression: Secondary | ICD-10-CM | POA: Diagnosis not present

## 2013-05-10 DIAGNOSIS — E782 Mixed hyperlipidemia: Secondary | ICD-10-CM | POA: Diagnosis not present

## 2013-05-10 DIAGNOSIS — I10 Essential (primary) hypertension: Secondary | ICD-10-CM | POA: Diagnosis not present

## 2013-05-17 DIAGNOSIS — H01009 Unspecified blepharitis unspecified eye, unspecified eyelid: Secondary | ICD-10-CM | POA: Diagnosis not present

## 2013-05-17 DIAGNOSIS — H538 Other visual disturbances: Secondary | ICD-10-CM | POA: Diagnosis not present

## 2013-05-17 DIAGNOSIS — H01029 Squamous blepharitis unspecified eye, unspecified eyelid: Secondary | ICD-10-CM | POA: Diagnosis not present

## 2013-05-17 DIAGNOSIS — M3500 Sicca syndrome, unspecified: Secondary | ICD-10-CM | POA: Diagnosis not present

## 2013-05-18 ENCOUNTER — Telehealth: Payer: Self-pay | Admitting: *Deleted

## 2013-05-18 NOTE — Telephone Encounter (Signed)
Appt scheduled 06/10/13 @ 3:30 has been cancelled. We need to reschedule due to Dr. Jannifer Franklin being out of office on that afternoon.

## 2013-06-02 DIAGNOSIS — M545 Low back pain, unspecified: Secondary | ICD-10-CM | POA: Diagnosis not present

## 2013-06-02 DIAGNOSIS — IMO0001 Reserved for inherently not codable concepts without codable children: Secondary | ICD-10-CM | POA: Diagnosis not present

## 2013-06-02 DIAGNOSIS — IMO0002 Reserved for concepts with insufficient information to code with codable children: Secondary | ICD-10-CM | POA: Diagnosis not present

## 2013-06-02 DIAGNOSIS — M461 Sacroiliitis, not elsewhere classified: Secondary | ICD-10-CM | POA: Diagnosis not present

## 2013-06-10 ENCOUNTER — Ambulatory Visit: Payer: Medicare Other | Admitting: Neurology

## 2013-06-13 DIAGNOSIS — H538 Other visual disturbances: Secondary | ICD-10-CM | POA: Diagnosis not present

## 2013-06-13 DIAGNOSIS — M3500 Sicca syndrome, unspecified: Secondary | ICD-10-CM | POA: Diagnosis not present

## 2013-06-20 ENCOUNTER — Encounter: Payer: Self-pay | Admitting: Neurology

## 2013-06-29 DIAGNOSIS — L819 Disorder of pigmentation, unspecified: Secondary | ICD-10-CM | POA: Diagnosis not present

## 2013-06-29 DIAGNOSIS — L719 Rosacea, unspecified: Secondary | ICD-10-CM | POA: Diagnosis not present

## 2013-07-08 ENCOUNTER — Ambulatory Visit: Payer: Medicare Other | Admitting: Neurology

## 2013-07-12 DIAGNOSIS — M5137 Other intervertebral disc degeneration, lumbosacral region: Secondary | ICD-10-CM | POA: Diagnosis not present

## 2013-07-12 DIAGNOSIS — R894 Abnormal immunological findings in specimens from other organs, systems and tissues: Secondary | ICD-10-CM | POA: Diagnosis not present

## 2013-07-12 DIAGNOSIS — M1990 Unspecified osteoarthritis, unspecified site: Secondary | ICD-10-CM | POA: Diagnosis not present

## 2013-07-12 DIAGNOSIS — IMO0001 Reserved for inherently not codable concepts without codable children: Secondary | ICD-10-CM | POA: Diagnosis not present

## 2013-07-14 DIAGNOSIS — M461 Sacroiliitis, not elsewhere classified: Secondary | ICD-10-CM | POA: Diagnosis not present

## 2013-07-14 DIAGNOSIS — G35 Multiple sclerosis: Secondary | ICD-10-CM | POA: Diagnosis not present

## 2013-07-14 DIAGNOSIS — Z5181 Encounter for therapeutic drug level monitoring: Secondary | ICD-10-CM | POA: Diagnosis not present

## 2013-07-14 DIAGNOSIS — G63 Polyneuropathy in diseases classified elsewhere: Secondary | ICD-10-CM | POA: Diagnosis not present

## 2013-07-14 DIAGNOSIS — M545 Low back pain, unspecified: Secondary | ICD-10-CM | POA: Diagnosis not present

## 2013-07-14 DIAGNOSIS — G603 Idiopathic progressive neuropathy: Secondary | ICD-10-CM | POA: Diagnosis not present

## 2013-07-14 DIAGNOSIS — Z79899 Other long term (current) drug therapy: Secondary | ICD-10-CM | POA: Diagnosis not present

## 2013-07-18 ENCOUNTER — Telehealth: Payer: Self-pay | Admitting: Neurology

## 2013-07-18 ENCOUNTER — Telehealth: Payer: Self-pay | Admitting: *Deleted

## 2013-07-18 NOTE — Telephone Encounter (Signed)
The patient will be having a mobility assessment. I will have the assessment done, the appointment will be rescheduled from [facility].

## 2013-07-18 NOTE — Telephone Encounter (Signed)
[employer] and electric wheelchair company faxed over face-to-face sheet for completion. I called patient to see if this is something she wanted to proceed with. She is undecided at this time, she does not want a big chair. She has a big chair that she is unable to use. She is ok to see Jinny Blossom with her regular follow up on 07/19/13. She will notify Dr. Jannifer Franklin if and when she decides on powerchair.

## 2013-07-18 NOTE — Telephone Encounter (Signed)
Spoke to [place] with [employer] and the patient requested electric wheelchair.  The patient was to let the office know that her follow up was to include the F2F for the mobility exam also, but we didn't receive that information.  The patient's appointment is with [facility].    Spoke to [place] and she relayed that the patient had not made up her mind about the chair, but that the company has been pressuring her for this mobility exam.  There will be no mobility exam at appointment on 07-19-13.  We will wait until the patient makes her own decision about this.

## 2013-07-18 NOTE — Telephone Encounter (Signed)
We will have a revisit appointment scheduled with me for the mobility assessment.

## 2013-07-19 ENCOUNTER — Ambulatory Visit: Payer: Medicare Other | Admitting: Neurology

## 2013-07-19 ENCOUNTER — Encounter (INDEPENDENT_AMBULATORY_CARE_PROVIDER_SITE_OTHER): Payer: Self-pay

## 2013-07-19 ENCOUNTER — Encounter: Payer: Self-pay | Admitting: Neurology

## 2013-07-19 ENCOUNTER — Encounter: Payer: Self-pay | Admitting: Adult Health

## 2013-07-19 ENCOUNTER — Ambulatory Visit (INDEPENDENT_AMBULATORY_CARE_PROVIDER_SITE_OTHER): Payer: Medicare Other | Admitting: Adult Health

## 2013-07-19 VITALS — BP 130/63 | HR 59 | Ht 66.0 in | Wt 161.0 lb

## 2013-07-19 DIAGNOSIS — R269 Unspecified abnormalities of gait and mobility: Secondary | ICD-10-CM | POA: Diagnosis not present

## 2013-07-19 DIAGNOSIS — G35 Multiple sclerosis: Secondary | ICD-10-CM

## 2013-07-19 MED ORDER — DIAZEPAM 2 MG PO TABS: 2.0000 mg | ORAL_TABLET | Freq: Three times a day (TID) | ORAL | Status: DC | PRN

## 2013-07-19 NOTE — Progress Notes (Addendum)
PATIENT: Stephanie Perez DOB: Oct 03, 1940  REASON FOR VISIT: follow up HISTORY FROM: patient  HISTORY OF PRESENT ILLNESS: Stephanie Perez is a 73 year old right-handed white female with a history of multiple sclerosis. She returns today for follow-up.  Patient was unable to ambulate at the last visit. She was sent to physical therapy and was given solu-medrol infusion followed by a prednisone dose pack. She states that she has slowly regained some of walking. She is able to ambulate with a rolling walker. She does use a motorized chair when at the grocery store. Patient states that her balance is off and she has to use a walker or grab bars. Patient denies changes in her vision. Denies changes with her bowels and bladder, she continues to use depends incase she does have accidents.  Patient does have some weakness in her legs more on the left leg. She also has some spasticity in both legs.  Since the last visit patient had some trouble with her blood pressure and had to go to the Emergency Room. Her PCP adjusted her Blood pressure medicines and now her blood pressure has been controlled. Patient reports that she suffered from depression and after starting these blood pressure medications her depression has improved.   REVIEW OF SYSTEMS: Full 14 system review of systems performed and notable only for:  Constitutional: N/A  Eyes: eye itching, eye redness, eye pain  Ear/Nose/Throat: N/A  Skin: N/A  Cardiovascular: N/A  Respiratory: N/A  Gastrointestinal: N/A  Genitourinary: N/A Hematology/Lymphatic: N/A  Endocrine: N/A Musculoskeletal: joint pain, joint swelling, aching muscles, walking difficulty  Allergy/Immunology: N/A  Neurological: numbness, weakness Psychiatric: depression nervous/anxious, some suicidal thoughts.  Sleep: N/A   ALLERGIES: Allergies  Allergen Reactions  . Bactrim [Sulfamethoxazole-Trimethoprim]   . Codeine Sulfate   . Lyrica [Pregabalin]   . Zanaflex [Tizanidine Hcl]      HOME MEDICATIONS: Outpatient Prescriptions Prior to Visit  Medication Sig Dispense Refill  . aspirin 81 MG tablet Take 81 mg by mouth daily.      . clonazePAM (KLONOPIN) 0.5 MG tablet Take 0.5 mg by mouth at bedtime as needed for anxiety.      . diazepam (VALIUM) 2 MG tablet Take 1 tablet (2 mg total) by mouth every 8 (eight) hours as needed for anxiety.  30 tablet  1  . docusate sodium (COLACE) 100 MG capsule Take 100 mg by mouth 3 (three) times daily as needed for constipation.      . DULoxetine (CYMBALTA) 60 MG capsule Take 60 mg by mouth daily.      [name] estradiol (ESTRACE) 1 MG tablet Take 1 mg by mouth daily.      [name] estrogen, conjugated,-medroxyprogesterone (PREMPRO) 0.625-2.5 MG per tablet Take 1 tablet by mouth daily.      . hydrochlorothiazide (HYDRODIURIL) 12.5 MG tablet Take 12.5 mg by mouth daily.      . hydroxychloroquine (PLAQUENIL) 200 MG tablet Take by mouth 2 (two) times daily.      [name] omeprazole (PRILOSEC) 20 MG capsule Take 1 capsule (20 mg total) by mouth daily.      [name] oxybutynin (DITROPAN) 5 MG tablet Take 1 tablet (5 mg total) by mouth 2 (two) times daily.  60 tablet  3  . predniSONE (DELTASONE) 10 MG tablet Began taking 6 tablets daily, taper by one tablet every other day until off the medication.  42 tablet  0  . traMADol (ULTRAM) 50 MG tablet Take 50 mg by mouth every 6 (six) hours  as needed for pain.      . valsartan (DIOVAN) 160 MG tablet Take 160 mg by mouth daily.       No facility-administered medications prior to visit.    PAST MEDICAL HISTORY: Past Medical History  Diagnosis Date  . Multiple sclerosis   . Spastic paraparesis   . Depression   . Anxiety   . Fibromyalgia   . Rheumatoid arteritis   . Neurogenic bladder   . Abnormality of gait   . Lumbago     PAST SURGICAL HISTORY: Past Surgical History  Procedure Laterality Date  . Abdominal hysterectomy    . Appendectomy    . Gallbladder surgery N/A   . Fracture surgery      FAMILY  HISTORY: Family History  Problem Relation Age of Onset  . Lung cancer Mother   . Heart disease Father   . Other Father   . Multiple sclerosis Brother     SOCIAL HISTORY: History   Social History  . Marital Status: Married    Spouse Name: N/A    Number of Children: 1  . Years of Education: college jr   Occupational History  . Not on file.   Social History Main Topics  . Smoking status: Never Smoker   . Smokeless tobacco: Never Used  . Alcohol Use: Yes     Comment: Drink wine on occasion  . Drug Use: No  . Sexual Activity: Not on file   Other Topics Concern  . Not on file   Social History Narrative  . No narrative on file      PHYSICAL EXAM  There were no vitals filed for this visit. There is no height or weight on file to calculate BMI.  Generalized: Well developed, in no acute distress   Neurological examination  Mentation: Alert oriented to time, place, history taking. Follows all commands speech and language fluent Cranial nerve II-XII: Extraocular movements were full, visual field were full on confrontational test.  Motor: The motor testing reveals 5 over 5 strength in upper extremities and 2/5 in lower extremities. Good symmetric motor tone is noted throughout.  Sensory: Sensory testing is intact to soft touch on all 4 extremities. No evidence of extinction is noted.  Coordination: Cerebellar testing reveals good finger-nose-finger and heel-to-shin bilaterally.  Gait and station: Gait is slow, uses a walker with ambulation. Legs are spastic/stiff. Tandem gait not tested. Romberg not tested. Reflexes: Deep tendon reflexes are symmetric and normal bilaterally.    DIAGNOSTIC DATA (LABS, IMAGING, TESTING) - I reviewed patient records, labs, notes, testing and imaging myself where available.  Lab Results  Component Value Date   WBC 10.1 05/19/2012   HGB 14.9 05/19/2012   HCT 45.3 05/19/2012   MCV 100* 05/19/2012      Component Value Date/Time   NA 140  05/19/2012 1024   K 3.9 05/19/2012 1024   CL 98 05/19/2012 1024   CO2 31* 05/19/2012 1024   GLUCOSE 83 05/19/2012 1024   BUN 13 05/19/2012 1024   CREATININE 0.80 05/19/2012 1024   CALCIUM 8.8 05/19/2012 1024   PROT 5.9* 05/19/2012 1024   AST 23 05/19/2012 1024   ALT 26 05/19/2012 1024   ALKPHOS 46 05/19/2012 1024   BILITOT 0.4 05/19/2012 1024   GFRNONAA 74 05/19/2012 1024   GFRAA 85 05/19/2012 1024    ASSESSMENT AND PLAN 73 y.o. year old female  has a past medical history of Multiple sclerosis; Spastic paraparesis; Depression; Anxiety; Fibromyalgia; Rheumatoid arteritis; Neurogenic bladder;  Abnormality of gait; and Lumbago. here with:  1. Multiple sclerosis 2. Abnormality of gait  Patient has improved since the last visit. She is now able to ambulate with a walker. Her gait is still very slow and she would benefit from a motorized wheelchair. We will schedule her next visit with Dr. Jannifer Franklin to do a mobility assessment. Patient would like to start  diazepam again to help with muscle spasms, I will refill today. Patient has not had an MRI since before 2006. I will order an MRI of the brain to look for progression of MS. Patient should follow-up in four months with Dr. Jannifer Franklin.   Ward Givens, MSN, NP-C 07/19/2013, 3:45 PM [facility] [address] [place], [place] [address] [phone]  Note: This document was prepared with digital dictation and possible smart phrase technology. Any transcriptional errors that result from this process are unintentional.

## 2013-07-19 NOTE — Progress Notes (Signed)
I have read the note, and I agree with the clinical assessment and plan.  Stephanie Perez   

## 2013-07-19 NOTE — Patient Instructions (Signed)
Multiple Sclerosis  Multiple sclerosis (MS) is a disease of the central nervous system. It leads to loss of the insulating covering of the nerves (myelin sheath) of your brain. When this happens, brain signals do not get transmitted properly or may not get transmitted at all. The symptoms of MS occur in episodes or attacks. These attacks may last weeks to months. There may be long periods of nearly no problems between attacks. The age of onset of MS varies.   CAUSES  The cause of MS is unknown. However, it is more common in the [place] than in the southern [place].  RISK FACTORS  There is a higher incidence of MS in women than in men. MS is not an inherited illness, although your risk of MS is higher if you have a relative with MS.  SIGNS AND SYMPTOMS   The symptoms of MS occur in episodes or attacks. These attacks may last weeks to months. There may be long periods of almost no symptoms between attacks.  The symptoms of MS vary. This is because of the many different ways it affects the central nervous system. The main symptoms of MS include:   Vision problems and eye pain.   Numbness.   Weakness.   Paralysis in your arms, hands, feet, and legs (extremities).   Balance problems.   Tremors.  DIAGNOSIS   Your health care provider can diagnose MS with the help of imaging exams and lab tests. These may include specialized X-ray exams and spinal fluid tests. The best imaging exam to confirm a diagnosis of MS is MRI.  TREATMENT   There is no known cure for MS, but there are medicines that can decrease the number and frequency of attacks. Steroids are often used for short-term relief. Physical and occupational therapy may also help.  HOME CARE INSTRUCTIONS    Take medicines as directed by your health care provider.   Exercise as directed by your health care provider.  SEEK MEDICAL CARE IF:  You begin to feel depressed.  SEEK IMMEDIATE MEDICAL CARE IF:   You develop paralysis.   You develop  problems with bladder, bowel, or sexual function.   You develop mental changes, such as forgetfulness or mood swings.   You have a seizure.  Document Released: 01/25/2000 Document Revised: 11/17/2012 Document Reviewed: 10/04/2012  [employer] Patient Information 2014 [employer].

## 2013-08-25 DIAGNOSIS — M545 Low back pain, unspecified: Secondary | ICD-10-CM | POA: Diagnosis not present

## 2013-08-25 DIAGNOSIS — S0003XA Contusion of scalp, initial encounter: Secondary | ICD-10-CM | POA: Diagnosis not present

## 2013-08-25 DIAGNOSIS — S0083XA Contusion of other part of head, initial encounter: Secondary | ICD-10-CM | POA: Diagnosis not present

## 2013-08-25 DIAGNOSIS — S63509A Unspecified sprain of unspecified wrist, initial encounter: Secondary | ICD-10-CM | POA: Diagnosis not present

## 2013-08-25 DIAGNOSIS — S1093XA Contusion of unspecified part of neck, initial encounter: Secondary | ICD-10-CM | POA: Diagnosis not present

## 2013-09-05 DIAGNOSIS — IMO0001 Reserved for inherently not codable concepts without codable children: Secondary | ICD-10-CM | POA: Diagnosis not present

## 2013-09-05 DIAGNOSIS — G63 Polyneuropathy in diseases classified elsewhere: Secondary | ICD-10-CM | POA: Diagnosis not present

## 2013-09-05 DIAGNOSIS — G603 Idiopathic progressive neuropathy: Secondary | ICD-10-CM | POA: Diagnosis not present

## 2013-09-05 DIAGNOSIS — M461 Sacroiliitis, not elsewhere classified: Secondary | ICD-10-CM | POA: Diagnosis not present

## 2013-09-05 DIAGNOSIS — G35 Multiple sclerosis: Secondary | ICD-10-CM | POA: Diagnosis not present

## 2013-09-08 ENCOUNTER — Telehealth: Payer: Self-pay | Admitting: Neurology

## 2013-09-08 NOTE — Telephone Encounter (Signed)
Stephanie Perez, this patient was to have an MRI done in June. It looks as if it has already been approved. She would like to have done in [place]. Can't we just fax the order over to their imaging center?

## 2013-09-08 NOTE — Telephone Encounter (Signed)
Patient calling to state that she was advised to get an MRI of her brain but patient has trouble getting around and wants to know if she can have her MRI done in [place] where she lives. Patient states ok to call anytime and that it is ok to leave a voicemail message. Please return call to patient and advise.

## 2013-09-09 ENCOUNTER — Telehealth: Payer: Self-pay | Admitting: Neurology

## 2013-09-09 NOTE — Telephone Encounter (Signed)
Patient requesting earlier appointment with Dr. Jannifer Franklin (11/25).  She stated she's falling within last week at least 6 times.  She's wanting to get a smaller wheelchair in order to maneuver around home without getting out of wheelchair.   Please call anytime and if not available please leave message.  [place] with [employer] is requesting a return call as well with appointment time and date.  [place] contact # [phone], option [phone].

## 2013-09-09 NOTE — Telephone Encounter (Signed)
Appointment has been scheduled for Mobility Exam. Order for MRI will be sent to [place].

## 2013-09-14 DIAGNOSIS — IMO0002 Reserved for concepts with insufficient information to code with codable children: Secondary | ICD-10-CM | POA: Diagnosis not present

## 2013-09-14 DIAGNOSIS — M545 Low back pain, unspecified: Secondary | ICD-10-CM | POA: Diagnosis not present

## 2013-09-17 DIAGNOSIS — S8000XA Contusion of unspecified knee, initial encounter: Secondary | ICD-10-CM | POA: Diagnosis not present

## 2013-09-17 DIAGNOSIS — M25569 Pain in unspecified knee: Secondary | ICD-10-CM | POA: Diagnosis not present

## 2013-09-21 DIAGNOSIS — M545 Low back pain, unspecified: Secondary | ICD-10-CM | POA: Diagnosis not present

## 2013-09-21 DIAGNOSIS — IMO0002 Reserved for concepts with insufficient information to code with codable children: Secondary | ICD-10-CM | POA: Diagnosis not present

## 2013-09-23 DIAGNOSIS — M545 Low back pain, unspecified: Secondary | ICD-10-CM | POA: Diagnosis not present

## 2013-09-23 DIAGNOSIS — IMO0002 Reserved for concepts with insufficient information to code with codable children: Secondary | ICD-10-CM | POA: Diagnosis not present

## 2013-09-26 DIAGNOSIS — R269 Unspecified abnormalities of gait and mobility: Secondary | ICD-10-CM | POA: Diagnosis not present

## 2013-09-26 DIAGNOSIS — G35 Multiple sclerosis: Secondary | ICD-10-CM | POA: Diagnosis not present

## 2013-09-27 ENCOUNTER — Telehealth: Payer: Self-pay | Admitting: Neurology

## 2013-09-27 NOTE — Telephone Encounter (Signed)
I called the patient again, I did not get an answer, I will call back tomorrow.

## 2013-09-27 NOTE — Telephone Encounter (Signed)
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 This was done at [place]. The prior study was done at [facility], on 05/31/2010. The disc is not available to me, no comparison was done. The study showed no enhancement of lesions.  I called patient. The patient did not answer, will need to call back later.

## 2013-09-28 DIAGNOSIS — M545 Low back pain, unspecified: Secondary | ICD-10-CM | POA: Diagnosis not present

## 2013-09-28 DIAGNOSIS — IMO0002 Reserved for concepts with insufficient information to code with codable children: Secondary | ICD-10-CM | POA: Diagnosis not present

## 2013-09-28 MED ORDER — PREDNISONE 10 MG PO TABS: ORAL_TABLET | ORAL | Status: DC

## 2013-09-28 NOTE — Telephone Encounter (Signed)
I called patient. The results of the MRI were reviewed, I do not have any comparison studies. The patient was asked to obtain the disc and send the disc of the MRI to our office. The patient indicates that over the last month or so, her ability to ambulate has changed, she has had several falls. She is being seen by pain management physician who has initiated physical therapy for her, she is an active physical therapy at this time. I will give a trial on a prednisone Dosepak at this time to see if this improves her functional level.

## 2013-09-30 DIAGNOSIS — M545 Low back pain, unspecified: Secondary | ICD-10-CM | POA: Diagnosis not present

## 2013-09-30 DIAGNOSIS — IMO0002 Reserved for concepts with insufficient information to code with codable children: Secondary | ICD-10-CM | POA: Diagnosis not present

## 2013-10-06 ENCOUNTER — Other Ambulatory Visit: Payer: Self-pay | Admitting: Neurology

## 2013-10-06 DIAGNOSIS — G63 Polyneuropathy in diseases classified elsewhere: Secondary | ICD-10-CM | POA: Diagnosis not present

## 2013-10-06 DIAGNOSIS — M545 Low back pain, unspecified: Secondary | ICD-10-CM | POA: Diagnosis not present

## 2013-10-06 DIAGNOSIS — G603 Idiopathic progressive neuropathy: Secondary | ICD-10-CM | POA: Diagnosis not present

## 2013-10-06 DIAGNOSIS — IMO0001 Reserved for inherently not codable concepts without codable children: Secondary | ICD-10-CM | POA: Diagnosis not present

## 2013-10-06 DIAGNOSIS — IMO0002 Reserved for concepts with insufficient information to code with codable children: Secondary | ICD-10-CM | POA: Diagnosis not present

## 2013-10-06 DIAGNOSIS — M47817 Spondylosis without myelopathy or radiculopathy, lumbosacral region: Secondary | ICD-10-CM | POA: Diagnosis not present

## 2013-10-06 MED ORDER — DIAZEPAM 2 MG PO TABS: 2.0000 mg | ORAL_TABLET | Freq: Three times a day (TID) | ORAL | Status: DC | PRN

## 2013-10-06 NOTE — Telephone Encounter (Signed)
Rx entered and forwarded to provider for approval.

## 2013-10-06 NOTE — Telephone Encounter (Signed)
°  Patient requesting refill of the Valium as it really helped with her multiple sclerosis. Best call back [phone]. She uses [id number] - [place], [place]

## 2013-10-06 NOTE — Telephone Encounter (Signed)
Rx signed and faxed.

## 2013-10-07 DIAGNOSIS — M999 Biomechanical lesion, unspecified: Secondary | ICD-10-CM | POA: Diagnosis not present

## 2013-10-07 DIAGNOSIS — M9981 Other biomechanical lesions of cervical region: Secondary | ICD-10-CM | POA: Diagnosis not present

## 2013-10-07 DIAGNOSIS — IMO0001 Reserved for inherently not codable concepts without codable children: Secondary | ICD-10-CM | POA: Diagnosis not present

## 2013-10-10 DIAGNOSIS — IMO0001 Reserved for inherently not codable concepts without codable children: Secondary | ICD-10-CM | POA: Diagnosis not present

## 2013-10-10 DIAGNOSIS — M9981 Other biomechanical lesions of cervical region: Secondary | ICD-10-CM | POA: Diagnosis not present

## 2013-10-10 DIAGNOSIS — M999 Biomechanical lesion, unspecified: Secondary | ICD-10-CM | POA: Diagnosis not present

## 2013-10-11 DIAGNOSIS — IMO0002 Reserved for concepts with insufficient information to code with codable children: Secondary | ICD-10-CM | POA: Diagnosis not present

## 2013-10-11 DIAGNOSIS — M545 Low back pain, unspecified: Secondary | ICD-10-CM | POA: Diagnosis not present

## 2013-10-13 ENCOUNTER — Telehealth: Payer: Self-pay | Admitting: Neurology

## 2013-10-13 DIAGNOSIS — IMO0002 Reserved for concepts with insufficient information to code with codable children: Secondary | ICD-10-CM | POA: Diagnosis not present

## 2013-10-13 DIAGNOSIS — M545 Low back pain, unspecified: Secondary | ICD-10-CM | POA: Diagnosis not present

## 2013-10-13 NOTE — Telephone Encounter (Signed)
I received the disc from as [place] of the MRI the brain. I do not have a comparison. This showed diffuse periventricular white matter changes, appear to be chronic. The last study was done at [facility] in 2012. I'll try to get a comparison of this disc with the one done at [facility].

## 2013-10-18 ENCOUNTER — Encounter: Payer: Self-pay | Admitting: Neurology

## 2013-10-18 ENCOUNTER — Telehealth: Payer: Self-pay | Admitting: Neurology

## 2013-10-18 ENCOUNTER — Ambulatory Visit (INDEPENDENT_AMBULATORY_CARE_PROVIDER_SITE_OTHER): Payer: Medicare Other | Admitting: Neurology

## 2013-10-18 VITALS — BP 137/77 | HR 56 | Wt 163.0 lb

## 2013-10-18 DIAGNOSIS — G35 Multiple sclerosis: Secondary | ICD-10-CM | POA: Diagnosis not present

## 2013-10-18 DIAGNOSIS — R269 Unspecified abnormalities of gait and mobility: Secondary | ICD-10-CM | POA: Diagnosis not present

## 2013-10-18 MED ORDER — DULOXETINE HCL 30 MG PO CPEP: ORAL_CAPSULE | ORAL | Status: DC

## 2013-10-18 NOTE — Progress Notes (Signed)
Reason for visit: Mobility assessment  Stephanie Perez is an 73 y.o. female  History of present illness:  Stephanie Perez is a 53 year old right-handed white female with a history of multiple sclerosis. The patient has had some progression of her ability to ambulate. Currently, she is on some prednisone which has helped some with her walking. In August of 2015, she fell 8 times. The patient is having increasing problems with spasticity of both legs, left greater than right. The patient does have a neurogenic bladder as well. She has a power wheelchair that she uses inside the home, but it is too large to get around the house, and therefore is ineffective. The patient also has difficulty with transportation of the power chair. The power  wheelchair was purchased greater than 13 years ago. The patient is able to ambulate short distances with a walker, but she oftentimes will fall doing this, putting her at risk. She has good stability sitting in a chair. The patient currently is involved with physical therapy. She has a good mental capacity, and she can operate a power chair or a scooter without any significant safety issues. The patient comes to the office today for a mobility assessment as she is trying to obtain a scooter for the home environment to allow her longer distance travel outside of the home, and higher safety of mobility inside the home. She clearly has a significant fall risk at this point. In the past, she has taken tizanidine and baclofen without good tolerance. She is on low-dose diazepam taking 2 mg on average daily, but she has not gone to higher doses.  Past Medical History  Diagnosis Date  . Multiple sclerosis   . Spastic paraparesis   . Depression   . Anxiety   . Fibromyalgia   . Rheumatoid arteritis   . Neurogenic bladder   . Abnormality of gait   . Lumbago     Past Surgical History  Procedure Laterality Date  . Abdominal hysterectomy    . Appendectomy    .  Gallbladder surgery N/A   . Fracture surgery      Family History  Problem Relation Age of Onset  . Lung cancer Mother   . Heart disease Father   . Other Father   . Multiple sclerosis Brother     Social history:  reports that she has never smoked. She has never used smokeless tobacco. She reports that she drinks alcohol. She reports that she does not use illicit drugs.    Allergies  Allergen Reactions  . Bactrim [Sulfamethoxazole-Trimethoprim]   . Codeine Sulfate   . Lyrica [Pregabalin]   . Zanaflex [Tizanidine Hcl]   . Sulfa Antibiotics     Medications:  Current Outpatient Prescriptions on File Prior to Visit  Medication Sig Dispense Refill  . aspirin 81 MG tablet Take 81 mg by mouth daily.      . clonazePAM (KLONOPIN) 0.5 MG tablet Take 0.5 mg by mouth at bedtime as needed for anxiety.      . diazepam (VALIUM) 2 MG tablet Take 1 tablet (2 mg total) by mouth every 8 (eight) hours as needed for anxiety.  30 tablet  5  . metoprolol tartrate (LOPRESSOR) 25 MG tablet       . omeprazole (PRILOSEC) 20 MG capsule Take 1 capsule (20 mg total) by mouth daily.      . predniSONE (DELTASONE) 10 MG tablet Begin taking 6 tablets daily, taper by one tablet every other day until  off the medication.  42 tablet  0  . RESTASIS 0.05 % ophthalmic emulsion       . traMADol (ULTRAM) 50 MG tablet Take 50 mg by mouth every 6 (six) hours as needed for pain.       No current facility-administered medications on file prior to visit.    ROS:  Out of a complete 14 system review of symptoms, the patient complains only of the following symptoms, and all other reviewed systems are negative.  Activity change, decreased activity Excessive sweating Heat intolerance Rectal bleeding, constipation Restless legs, snoring Incontinence of bladder Jaw pain, low back pain, walking difficulties Numbness, weakness Depression, anxiety  Blood pressure 137/77, pulse 56, height 0' (0 m), weight 163 lb (73.936  kg).  Physical Exam  General: The patient is alert and cooperative at the time of the examination.  Skin: No significant peripheral edema is noted.   Neurologic Exam  Mental status: The patient is oriented x 3.  Cranial nerves: Facial symmetry is present. Speech is normal, no aphasia or dysarthria is noted. Extraocular movements are full. Visual fields are full. Pupils are equal, round, and reactive to light. Discs are flat bilaterally.  Motor: The patient has good strength in the upper extremities. With the lower extremities, there is increased motor tone bilaterally, left greater than right, with 4/5 strength proximally bilaterally. The patient has difficulty relaxing the leg after extension at the knee on both sides, left greater than right.  Sensory examination: Soft touch sensation is decreased on the left foot as compared to the right, symmetric, arms and face.  Coordination: The patient has good finger-nose-finger and heel-to-shin bilaterally.  Gait and station: The patient has a spastic diplegia gait, left greater than right. Tandem gait was not attempted. Romberg is positive. No drift is seen.  Reflexes: Deep tendon reflexes are symmetric, reflexes somewhat increased in the legs.   Assessment/Plan:  One. Multiple sclerosis  2. Gait disorder  3. Neurogenic bladder  The patient is an adequate candidate for a scooter in the home environment. The patient has the cognitive capacity to use a scooter. The patient requires a mobility device small enough to maneuver throughout her house. She clearly is at risk for falls, and she has had quite a number of falls within the last month. The patient has significant spasticity of both lower extremities, left greater than right, and I would wonder whether or not she could benefit from an AFO brace on the left foot. The patient should try to go up on the diazepam taking 2 mg twice daily, and eventually 2 mg 3 times daily. The patient has  the axial stability to manage a scooter, and she has the cognitive abilities to operate a scooter safely. The scooter would be light enough that she could transport this in her current motor vehicle, and a scooter would allow her to safely travel without risk of falling. The patient otherwise will followup in about 4 months.  Jill Alexanders MD 10/18/2013 7:34 PM  [facility] [address] [place], [place] [address]  Phone [phone] Fax [phone]

## 2013-10-18 NOTE — Patient Instructions (Addendum)
? Will AFO brace help the left leg with walking?   Multiple Sclerosis Multiple sclerosis (MS) is a disease of the central nervous system. It leads to the loss of the insulating covering of the nerves (myelin sheath) of your brain. When this happens, brain signals do not get sent properly or may not get sent at all. The age of onset of MS varies.  CAUSES The cause of MS is unknown. However, it is more common in the [place] than in the [place]. RISK FACTORS There is a higher number of women with MS than men. MS is not an illness that is passed down to you from your family members (inherited). However, your risk of MS is higher if you have a relative with MS. SIGNS AND SYMPTOMS  The symptoms of MS occur in episodes or attacks. These attacks may last weeks to months. There may be long periods of almost no symptoms between attacks. The symptoms of MS vary. This is because of the many different ways it affects the central nervous system. The main symptoms of MS include:  Vision problems and eye pain.  Numbness.  Weakness.  Inability to move your arms, hands, feet, or legs (paralysis).  Balance problems.  Tremors. DIAGNOSIS  Your health care provider can diagnose MS with the help of imaging exams and lab tests. These may include specialized X-ray exams and spinal fluid tests. The best imaging exam to confirm a diagnosis of MS is an MRI. TREATMENT  There is no known cure for MS, but there are medicines that can decrease the number and frequency of attacks. Steroids are often used for short-term relief. Physical and occupational therapy may also help. There are also many new alternative or complementary treatments available to help control the symptoms of MS. Ask your health care provider if any of these other options are right for you. HOME CARE INSTRUCTIONS   Take medicines as directed by your health care provider.  Exercise as directed by your health care  provider. SEEK MEDICAL CARE IF: You begin to feel depressed. SEEK IMMEDIATE MEDICAL CARE IF:  You develop paralysis.  You have problems with bladder, bowel, or sexual function.  You develop mental changes, such as forgetfulness or mood swings.  You have a period of uncontrolled movements (seizure). Document Released: 01/25/2000 Document Revised: 02/01/2013 Document Reviewed: 10/04/2012 [facility] Patient Information 2015 [place], [place]. This information is not intended to replace advice given to you by your health care provider. Make sure you discuss any questions you have with your health care provider. Fall Prevention and Home Safety Falls cause injuries and can affect all age groups. It is possible to use preventive measures to significantly decrease the likelihood of falls. There are many simple measures which can make your home safer and prevent falls. OUTDOORS  Repair cracks and edges of walkways and driveways.  Remove high doorway thresholds.  Trim shrubbery on the main path into your home.  Have good outside lighting.  Clear walkways of tools, rocks, debris, and clutter.  Check that handrails are not broken and are securely fastened. Both sides of steps should have handrails.  Have leaves, snow, and ice cleared regularly.  Use sand or salt on walkways during winter months.  In the garage, clean up grease or oil spills. BATHROOM  Install night lights.  Install grab bars by the toilet and in the tub and shower.  Use non-skid mats or decals in the tub or shower.  Place a plastic  non-slip stool in the shower to sit on, if needed.  Keep floors dry and clean up all water on the floor immediately.  Remove soap buildup in the tub or shower on a regular basis.  Secure bath mats with non-slip, double-sided rug tape.  Remove throw rugs and tripping hazards from the floors. BEDROOMS  Install night lights.  Make sure a bedside light is easy to reach.  Do not use  oversized bedding.  Keep a telephone by your bedside.  Have a firm chair with side arms to use for getting dressed.  Remove throw rugs and tripping hazards from the floor. KITCHEN  Keep handles on pots and pans turned toward the center of the stove. Use back burners when possible.  Clean up spills quickly and allow time for drying.  Avoid walking on wet floors.  Avoid hot utensils and knives.  Position shelves so they are not too high or low.  Place commonly used objects within easy reach.  If necessary, use a sturdy step stool with a grab bar when reaching.  Keep electrical cables out of the way.  Do not use floor polish or wax that makes floors slippery. If you must use wax, use non-skid floor wax.  Remove throw rugs and tripping hazards from the floor. STAIRWAYS  Never leave objects on stairs.  Place handrails on both sides of stairways and use them. Fix any loose handrails. Make sure handrails on both sides of the stairways are as long as the stairs.  Check carpeting to make sure it is firmly attached along stairs. Make repairs to worn or loose carpet promptly.  Avoid placing throw rugs at the top or bottom of stairways, or properly secure the rug with carpet tape to prevent slippage. Get rid of throw rugs, if possible.  Have an electrician put in a light switch at the top and bottom of the stairs. OTHER FALL PREVENTION TIPS  Wear low-heel or rubber-soled shoes that are supportive and fit well. Wear closed toe shoes.  When using a stepladder, make sure it is fully opened and both spreaders are firmly locked. Do not climb a closed stepladder.  Add color or contrast paint or tape to grab bars and handrails in your home. Place contrasting color strips on first and last steps.  Learn and use mobility aids as needed. Install an electrical emergency response system.  Turn on lights to avoid dark areas. Replace light bulbs that burn out immediately. Get light switches  that glow.  Arrange furniture to create clear pathways. Keep furniture in the same place.  Firmly attach carpet with non-skid or double-sided tape.  Eliminate uneven floor surfaces.  Select a carpet pattern that does not visually hide the edge of steps.  Be aware of all pets. OTHER HOME SAFETY TIPS  Set the water temperature for 120 F (48.8 C).  Keep emergency numbers on or near the telephone.  Keep smoke detectors on every level of the home and near sleeping areas. Document Released: 01/17/2002 Document Revised: 07/29/2011 Document Reviewed: 04/18/2011 [facility] Patient Information 2015 [place], [place]. This information is not intended to replace advice given to you by your health care provider. Make sure you discuss any questions you have with your health care provider.

## 2013-10-18 NOTE — Telephone Encounter (Signed)
I have made comparison between the 2 MRI brain studies that were done, one on 05/31/2010, the other done on 09/26/2013. This last study was done in [place], [place]. The studies appear to be relatively similar, no significant changes over time.

## 2013-10-19 ENCOUNTER — Telehealth: Payer: Self-pay | Admitting: *Deleted

## 2013-10-19 ENCOUNTER — Telehealth: Payer: Self-pay | Admitting: Neurology

## 2013-10-19 MED ORDER — DIAZEPAM 2 MG PO TABS: 2.0000 mg | ORAL_TABLET | Freq: Three times a day (TID) | ORAL | Status: DC

## 2013-10-19 NOTE — Telephone Encounter (Signed)
Patient wants to let you know if she is to work up to 3 tabs a day Valium she will need future rx sent to [facility].

## 2013-10-19 NOTE — Telephone Encounter (Signed)
Patient calling for MRI results.  Please call anytime and may leave detailed message on voicemail.

## 2013-10-19 NOTE — Addendum Note (Signed)
Addended by: Margette Fast on: 10/19/2013 11:33 AM   Modules accepted: Orders

## 2013-10-19 NOTE — Telephone Encounter (Signed)
I will call in a prescription for this.

## 2013-10-19 NOTE — Telephone Encounter (Signed)
Patient aware of results.

## 2013-10-20 DIAGNOSIS — M545 Low back pain, unspecified: Secondary | ICD-10-CM | POA: Diagnosis not present

## 2013-10-20 DIAGNOSIS — IMO0002 Reserved for concepts with insufficient information to code with codable children: Secondary | ICD-10-CM | POA: Diagnosis not present

## 2013-10-24 DIAGNOSIS — IMO0002 Reserved for concepts with insufficient information to code with codable children: Secondary | ICD-10-CM | POA: Diagnosis not present

## 2013-10-24 DIAGNOSIS — M545 Low back pain, unspecified: Secondary | ICD-10-CM | POA: Diagnosis not present

## 2013-10-26 DIAGNOSIS — M545 Low back pain, unspecified: Secondary | ICD-10-CM | POA: Diagnosis not present

## 2013-10-26 DIAGNOSIS — IMO0002 Reserved for concepts with insufficient information to code with codable children: Secondary | ICD-10-CM | POA: Diagnosis not present

## 2013-10-27 DIAGNOSIS — Z1231 Encounter for screening mammogram for malignant neoplasm of breast: Secondary | ICD-10-CM | POA: Diagnosis not present

## 2013-10-27 DIAGNOSIS — N951 Menopausal and female climacteric states: Secondary | ICD-10-CM | POA: Diagnosis not present

## 2013-10-27 DIAGNOSIS — M818 Other osteoporosis without current pathological fracture: Secondary | ICD-10-CM | POA: Diagnosis not present

## 2013-10-31 DIAGNOSIS — M545 Low back pain, unspecified: Secondary | ICD-10-CM | POA: Diagnosis not present

## 2013-10-31 DIAGNOSIS — IMO0002 Reserved for concepts with insufficient information to code with codable children: Secondary | ICD-10-CM | POA: Diagnosis not present

## 2013-11-03 DIAGNOSIS — M545 Low back pain, unspecified: Secondary | ICD-10-CM | POA: Diagnosis not present

## 2013-11-03 DIAGNOSIS — IMO0002 Reserved for concepts with insufficient information to code with codable children: Secondary | ICD-10-CM | POA: Diagnosis not present

## 2013-11-09 DIAGNOSIS — IMO0002 Reserved for concepts with insufficient information to code with codable children: Secondary | ICD-10-CM | POA: Diagnosis not present

## 2013-11-09 DIAGNOSIS — M545 Low back pain, unspecified: Secondary | ICD-10-CM | POA: Diagnosis not present

## 2013-11-14 ENCOUNTER — Telehealth: Payer: Self-pay | Admitting: Adult Health

## 2013-11-14 NOTE — Telephone Encounter (Signed)
I called the patient. She states that she's been having some swelling in her feet and hands. She states that she continues to have issues with her balance however she denies falls. She is currently in physical therapy for gait and balance training. She states that she has been having some more mood swings. She feels that she gets really good benefit from the prednisone. However she just had a prednisone Dosepak in August. I explained to the patient that this does not sound like an MS exacerbation and therefore prednisone is not warranted at this time. Issues with her balance has been ongoing. I advised her to call her primary care provider and make them aware of the swelling in her feet and hands. She verbalized understanding. If mood swings continue to be an issue in the future her Cymbalta may need to be increased. I advised the patient to call us if her symptoms worsen or she develops any other symptoms.

## 2013-11-14 NOTE — Telephone Encounter (Signed)
See phone note

## 2013-11-14 NOTE — Telephone Encounter (Signed)
Patient is calling to see if she can continue taking Prednisone. Patient has balance problems, swelling of the feet, has mood swings and feels more numb from the waist down. Patient states she felt extremely better when taking Prednisone. Please call patient.

## 2013-11-16 DIAGNOSIS — R260 Ataxic gait: Secondary | ICD-10-CM | POA: Diagnosis not present

## 2013-11-18 DIAGNOSIS — R260 Ataxic gait: Secondary | ICD-10-CM | POA: Diagnosis not present

## 2013-11-22 DIAGNOSIS — R260 Ataxic gait: Secondary | ICD-10-CM | POA: Diagnosis not present

## 2013-11-29 DIAGNOSIS — R260 Ataxic gait: Secondary | ICD-10-CM | POA: Diagnosis not present

## 2013-12-01 DIAGNOSIS — R260 Ataxic gait: Secondary | ICD-10-CM | POA: Diagnosis not present

## 2013-12-06 DIAGNOSIS — R260 Ataxic gait: Secondary | ICD-10-CM | POA: Diagnosis not present

## 2013-12-08 DIAGNOSIS — R260 Ataxic gait: Secondary | ICD-10-CM | POA: Diagnosis not present

## 2013-12-14 DIAGNOSIS — G35 Multiple sclerosis: Secondary | ICD-10-CM | POA: Diagnosis not present

## 2013-12-14 DIAGNOSIS — R260 Ataxic gait: Secondary | ICD-10-CM | POA: Diagnosis not present

## 2013-12-21 DIAGNOSIS — R260 Ataxic gait: Secondary | ICD-10-CM | POA: Diagnosis not present

## 2013-12-21 DIAGNOSIS — G35 Multiple sclerosis: Secondary | ICD-10-CM | POA: Diagnosis not present

## 2013-12-22 DIAGNOSIS — M3501 Sicca syndrome with keratoconjunctivitis: Secondary | ICD-10-CM | POA: Diagnosis not present

## 2013-12-26 DIAGNOSIS — M545 Low back pain: Secondary | ICD-10-CM | POA: Diagnosis not present

## 2013-12-26 DIAGNOSIS — M797 Fibromyalgia: Secondary | ICD-10-CM | POA: Diagnosis not present

## 2013-12-26 DIAGNOSIS — R609 Edema, unspecified: Secondary | ICD-10-CM | POA: Diagnosis not present

## 2013-12-26 DIAGNOSIS — I10 Essential (primary) hypertension: Secondary | ICD-10-CM | POA: Diagnosis not present

## 2013-12-28 ENCOUNTER — Encounter: Payer: Self-pay | Admitting: Neurology

## 2014-01-03 ENCOUNTER — Encounter: Payer: Self-pay | Admitting: Neurology

## 2014-01-04 ENCOUNTER — Ambulatory Visit: Payer: Medicare Other | Admitting: Neurology

## 2014-01-04 ENCOUNTER — Telehealth: Payer: Self-pay | Admitting: Neurology

## 2014-01-04 DIAGNOSIS — M461 Sacroiliitis, not elsewhere classified: Secondary | ICD-10-CM | POA: Diagnosis not present

## 2014-01-04 DIAGNOSIS — M545 Low back pain: Secondary | ICD-10-CM | POA: Diagnosis not present

## 2014-01-04 MED ORDER — PREDNISONE 10 MG PO TABS: ORAL_TABLET | ORAL | Status: DC

## 2014-01-04 NOTE — Telephone Encounter (Signed)
I called patient. The patient has had some increased difficulty with walking and some pain, currently she is in physical therapy. I will call in a prednisone Dosepak to see if this may help her over the next several days. She is to contact our office if she believes that she continues to worsen.

## 2014-01-04 NOTE — Telephone Encounter (Signed)
Patient calling to request that Dr. Jannifer Franklin call in a steroid for her until she can be seen, please return call and advise, patient uses the [facility] on [address].

## 2014-01-04 NOTE — Telephone Encounter (Signed)
I called back.  Stephanie Perez said she feels she is having an Stephanie flare.  Says she is experiencing pain and difficulty walking at times, and has a great deal of stress due to husbands illness.  She is currently taking Tramadol for pain, but it has not been effective.  She wanted Dr Jannifer Franklin to know her PCP ordered PT, and says this has been helpful.  She would like to know if Prednisone can be prescribed for her to take over the holidays.  Please advise.  Thank you.

## 2014-02-16 DIAGNOSIS — M5136 Other intervertebral disc degeneration, lumbar region: Secondary | ICD-10-CM | POA: Diagnosis not present

## 2014-02-16 DIAGNOSIS — D472 Monoclonal gammopathy: Secondary | ICD-10-CM | POA: Diagnosis not present

## 2014-02-16 DIAGNOSIS — F329 Major depressive disorder, single episode, unspecified: Secondary | ICD-10-CM | POA: Diagnosis not present

## 2014-02-16 DIAGNOSIS — R296 Repeated falls: Secondary | ICD-10-CM | POA: Diagnosis not present

## 2014-02-16 DIAGNOSIS — S60221A Contusion of right hand, initial encounter: Secondary | ICD-10-CM | POA: Diagnosis not present

## 2014-02-16 DIAGNOSIS — R768 Other specified abnormal immunological findings in serum: Secondary | ICD-10-CM | POA: Diagnosis not present

## 2014-02-16 DIAGNOSIS — M150 Primary generalized (osteo)arthritis: Secondary | ICD-10-CM | POA: Diagnosis not present

## 2014-02-16 DIAGNOSIS — Z23 Encounter for immunization: Secondary | ICD-10-CM | POA: Diagnosis not present

## 2014-02-16 DIAGNOSIS — M797 Fibromyalgia: Secondary | ICD-10-CM | POA: Diagnosis not present

## 2014-02-16 DIAGNOSIS — M5106 Intervertebral disc disorders with myelopathy, lumbar region: Secondary | ICD-10-CM | POA: Diagnosis not present

## 2014-02-16 DIAGNOSIS — M25559 Pain in unspecified hip: Secondary | ICD-10-CM | POA: Diagnosis not present

## 2014-02-16 DIAGNOSIS — Z9181 History of falling: Secondary | ICD-10-CM | POA: Diagnosis not present

## 2014-02-17 ENCOUNTER — Ambulatory Visit: Payer: Medicare Other | Admitting: Adult Health

## 2014-02-27 DIAGNOSIS — R0781 Pleurodynia: Secondary | ICD-10-CM | POA: Diagnosis not present

## 2014-02-27 DIAGNOSIS — W19XXXA Unspecified fall, initial encounter: Secondary | ICD-10-CM | POA: Diagnosis not present

## 2014-03-02 DIAGNOSIS — M25552 Pain in left hip: Secondary | ICD-10-CM | POA: Diagnosis not present

## 2014-03-15 DIAGNOSIS — D472 Monoclonal gammopathy: Secondary | ICD-10-CM | POA: Diagnosis not present

## 2014-03-15 DIAGNOSIS — R300 Dysuria: Secondary | ICD-10-CM | POA: Diagnosis not present

## 2014-03-15 DIAGNOSIS — G35 Multiple sclerosis: Secondary | ICD-10-CM | POA: Diagnosis not present

## 2014-03-15 DIAGNOSIS — E782 Mixed hyperlipidemia: Secondary | ICD-10-CM | POA: Diagnosis not present

## 2014-03-15 DIAGNOSIS — I10 Essential (primary) hypertension: Secondary | ICD-10-CM | POA: Diagnosis not present

## 2014-03-15 DIAGNOSIS — K921 Melena: Secondary | ICD-10-CM | POA: Diagnosis not present

## 2014-03-15 DIAGNOSIS — M545 Low back pain: Secondary | ICD-10-CM | POA: Diagnosis not present

## 2014-03-15 DIAGNOSIS — M797 Fibromyalgia: Secondary | ICD-10-CM | POA: Diagnosis not present

## 2014-03-16 ENCOUNTER — Ambulatory Visit (INDEPENDENT_AMBULATORY_CARE_PROVIDER_SITE_OTHER): Payer: Medicare Other | Admitting: Adult Health

## 2014-03-16 ENCOUNTER — Encounter: Payer: Self-pay | Admitting: Adult Health

## 2014-03-16 VITALS — BP 129/62 | HR 57 | Ht 66.0 in | Wt 160.0 lb

## 2014-03-16 DIAGNOSIS — R269 Unspecified abnormalities of gait and mobility: Secondary | ICD-10-CM

## 2014-03-16 DIAGNOSIS — G35 Multiple sclerosis: Secondary | ICD-10-CM | POA: Diagnosis not present

## 2014-03-16 DIAGNOSIS — M25559 Pain in unspecified hip: Secondary | ICD-10-CM | POA: Diagnosis not present

## 2014-03-16 DIAGNOSIS — R296 Repeated falls: Secondary | ICD-10-CM | POA: Diagnosis not present

## 2014-03-16 DIAGNOSIS — N319 Neuromuscular dysfunction of bladder, unspecified: Secondary | ICD-10-CM | POA: Diagnosis not present

## 2014-03-16 NOTE — Patient Instructions (Signed)
Please go to physical therapy to help with your falls- motorized wheelchair may be needed. If your pelvis remains sore- please let us know and we can do an x-ray. Keep appointment with psychiatrist. If your symptoms worsen or you develop new symptoms please let us know.

## 2014-03-16 NOTE — Progress Notes (Signed)
I have read the note, and I agree with the clinical assessment and plan.  Stephanie Perez KEITH   

## 2014-03-16 NOTE — Progress Notes (Signed)
PATIENT: Stephanie Perez DOB: 12-12-1940  REASON FOR VISIT: follow up- Multiple Sclerosis, abnormality of gait HISTORY FROM: patient  HISTORY OF PRESENT ILLNESS: [name] is a 74 year old female with a history of multiple sclerosis and abnormality of gait. She returns for follow-up. The patient does have some spasticity of the lower legs. Her diazepam was increased to 2 mg TID. She reports that she hasn't really been taking it due to drowsiness and she didn't want it to cause her to fall. She uses a motorized wheelchair to get around. She states that she has fallen at least 12 times since the last visit. She states that she will lean to the right  when she walks and her feet get tangled and she will fall. She states that see saw her MD at [facility] for fibromyalgia and she was having left hip pain from a fall.  She had a MRI of the hip and it just showed bruising. She had the ribs x-rayed recently due to a fall and it was unremarkable. She states she has been having pain above her pubic bone and it will hurt when she moves a certain way. Her family doctor has ordered physical therapy. She does have a neurogenic bladder. She denies any new numbness or weakness. Denies any changes with her bowels. No change in vision. She states that she has an appointment with a psychiatrist at [facility] in the upcoming weeks to talk about her depression/anxiety.     HISTORY 10/18/13 (willis): [name] is a 75 year old right-handed white female with a history of multiple sclerosis. The patient has had some progression of her ability to ambulate. Currently, she is on some prednisone which has helped some with her walking. In August of 2015, she fell 8 times. The patient is having increasing problems with spasticity of both legs, left greater than right. The patient does have a neurogenic bladder as well. She has a power wheelchair that she uses inside the home, but it is too large to get around the house, and therefore is  ineffective. The patient also has difficulty with transportation of the power chair. The power  wheelchair was purchased greater than 13 years ago. The patient is able to ambulate short distances with a walker, but she oftentimes will fall doing this, putting her at risk. She has good stability sitting in a chair. The patient currently is involved with physical therapy. She has a good mental capacity, and she can operate a power chair or a scooter without any significant safety issues. The patient comes to the office today for a mobility assessment as she is trying to obtain a scooter for the home environment to allow her longer distance travel outside of the home, and higher safety of mobility inside the home. She clearly has a significant fall risk at this point. In the past, she has taken tizanidine and baclofen without good tolerance. She is on low-dose diazepam taking 2 mg on average daily, but she has not gone to higher doses.  REVIEW OF SYSTEMS: Out of a complete 14 system review of symptoms, the patient complains only of the following symptoms, and all other reviewed systems are negative.  Activity change, fatigue, restless leg, frequent waking, daytime sleepiness, sleep talking, sleep walking, frequency of urination, joint pian, back pain, aching muscles, walking difficulty, depression, nervous, anxioua, dizziness, headache, weakness  ALLERGIES: Allergies  Allergen Reactions  . Bactrim [Sulfamethoxazole-Trimethoprim]   . Codeine Sulfate   . Lyrica [Pregabalin]   .  Zanaflex [Tizanidine Hcl]   . Sulfa Antibiotics     HOME MEDICATIONS: Outpatient Prescriptions Prior to Visit  Medication Sig Dispense Refill  . aspirin 81 MG tablet Take 81 mg by mouth daily.    . clonazePAM (KLONOPIN) 0.5 MG tablet Take 0.5 mg by mouth at bedtime as needed for anxiety.    . DULoxetine (CYMBALTA) 30 MG capsule One tablet in the morning and two tablets at night    . metoprolol tartrate (LOPRESSOR) 25 MG  tablet     . omeprazole (PRILOSEC) 20 MG capsule Take 1 capsule (20 mg total) by mouth daily.    . RESTASIS 0.05 % ophthalmic emulsion     . traMADol (ULTRAM) 50 MG tablet Take 50 mg by mouth every 6 (six) hours as needed for pain.    . valsartan-hydrochlorothiazide (DIOVAN-HCT) 160-12.5 MG per tablet 1 tablet every morning.    . diazepam (VALIUM) 2 MG tablet Take 1 tablet (2 mg total) by mouth 3 (three) times daily. (Patient not taking: Reported on 03/16/2014) 270 tablet 1  . predniSONE (DELTASONE) 10 MG tablet Begin taking 6 tablets daily, taper by one tablet every other day until off the medication. (Patient not taking: Reported on 03/16/2014) 42 tablet 0   No facility-administered medications prior to visit.    PAST MEDICAL HISTORY: Past Medical History  Diagnosis Date  . Multiple sclerosis   . Spastic paraparesis   . Depression   . Anxiety   . Fibromyalgia   . Rheumatoid arteritis   . Neurogenic bladder   . Abnormality of gait   . Lumbago     PAST SURGICAL HISTORY: Past Surgical History  Procedure Laterality Date  . Abdominal hysterectomy    . Appendectomy    . Gallbladder surgery N/A   . Fracture surgery      FAMILY HISTORY: Family History  Problem Relation Age of Onset  . Lung cancer Mother   . Heart disease Father   . Other Father   . Multiple sclerosis Brother          PHYSICAL EXAM  Filed Vitals:   03/16/14 1319  BP: 129/62  Pulse: 57  Height: 5\' 6"  (1.676 m)  Weight: 160 lb (72.576 kg)   Body mass index is 25.84 kg/(m^2).  Generalized: Well developed, in no acute distress  Skin: 2+ pitting edema in the left lower leg.   Neurological examination  Mentation: Alert oriented to time, place, history taking. Follows all commands speech and language fluent Cranial nerve II-XII: Pupils were equal round reactive to light. Extraocular movements were full, visual field were full on confrontational test. Facial sensation and strength were normal. Uvula  tongue midline. Head turning and shoulder shrug  were normal and symmetric. Motor: The motor testing reveals 5 over 5 strength in the upper extremities. 4/5 strength in the right lower extremity and 3/5 in the left lower extremity. Once leg is extended bilaterally she has a hard time bending the knees back to a resting position- usually has to assist her legs to the position.  Sensory: Sensory testing is intact to soft touch on all 4 extremities. No evidence of extinction is noted.  Coordination: Cerebellar testing reveals good finger-nose-finger and heel-to-shin bilaterally.  Gait and station: in a scooter- did not attempt to stand.  Reflexes: Deep tendon reflexes are symmetric and normal bilaterally.     DIAGNOSTIC DATA (LABS, IMAGING, TESTING) - I reviewed patient records, labs, notes, testing and imaging myself where available.  ASSESSMENT AND PLAN 74 y.o. year old female  has a past medical history of Multiple sclerosis; Spastic paraparesis; Depression; Anxiety; Fibromyalgia; Rheumatoid arteritis; Neurogenic bladder; Abnormality of gait; and Lumbago. here with:  1. multiple sclerosis 2. Abnormality of gait 3. Neurogenic bladder. 4. Pelvic pain 5. Frequent falls   Patient continues to have frequent falls. Her family MD has requested physical therapy for the patient. I agree that a physical therapy evaluation is needed. Patient may need a motorized wheelchair to use at all times in the home. She is on a scooter today. Patient is having some pelvic pain specifically at the pubis symphysis. A pelvic x-ray was recommended but the patient would like to wait to see if the pain gets better first. I have encouraged the patient to keep her appointment with her psychiatrist. I do believe the patient suffers with anxiety. She was advised that if her symptoms worsen or she develops new symptoms to let us know. F/U in 4-5 months or sooner if needed.   Ward Givens, MSN, NP-C 03/16/2014, 1:26  PM [facility] [address], [place], [place] [address] [phone]  Note: This document was prepared with digital dictation and possible smart phrase technology. Any transcriptional errors that result from this process are unintentional.

## 2014-03-31 DIAGNOSIS — I10 Essential (primary) hypertension: Secondary | ICD-10-CM | POA: Diagnosis not present

## 2014-03-31 DIAGNOSIS — R5383 Other fatigue: Secondary | ICD-10-CM | POA: Diagnosis not present

## 2014-03-31 DIAGNOSIS — E782 Mixed hyperlipidemia: Secondary | ICD-10-CM | POA: Diagnosis not present

## 2014-04-03 DIAGNOSIS — D472 Monoclonal gammopathy: Secondary | ICD-10-CM | POA: Diagnosis not present

## 2014-04-03 DIAGNOSIS — M810 Age-related osteoporosis without current pathological fracture: Secondary | ICD-10-CM | POA: Diagnosis not present

## 2014-04-03 DIAGNOSIS — Z9189 Other specified personal risk factors, not elsewhere classified: Secondary | ICD-10-CM | POA: Diagnosis not present

## 2014-04-03 DIAGNOSIS — G35 Multiple sclerosis: Secondary | ICD-10-CM | POA: Diagnosis not present

## 2014-04-03 DIAGNOSIS — E782 Mixed hyperlipidemia: Secondary | ICD-10-CM | POA: Diagnosis not present

## 2014-04-03 DIAGNOSIS — M797 Fibromyalgia: Secondary | ICD-10-CM | POA: Diagnosis not present

## 2014-04-03 DIAGNOSIS — M05419 Rheumatoid myopathy with rheumatoid arthritis of unspecified shoulder: Secondary | ICD-10-CM | POA: Diagnosis not present

## 2014-04-03 DIAGNOSIS — M545 Low back pain: Secondary | ICD-10-CM | POA: Diagnosis not present

## 2014-04-03 DIAGNOSIS — Z1389 Encounter for screening for other disorder: Secondary | ICD-10-CM | POA: Diagnosis not present

## 2014-04-03 DIAGNOSIS — I10 Essential (primary) hypertension: Secondary | ICD-10-CM | POA: Diagnosis not present

## 2014-04-11 ENCOUNTER — Telehealth: Payer: Self-pay | Admitting: Adult Health

## 2014-04-11 MED ORDER — PREDNISONE 5 MG PO TABS: 5.0000 mg | ORAL_TABLET | Freq: Every day | ORAL | Status: DC

## 2014-04-11 NOTE — Telephone Encounter (Signed)
Patient stated she's seen a Rheumatologist @ [facility] for several yrs for fibromyalgia.  Recently in the last 6 months her gait has worsen, the less she walks the more pain she experience all over along with extreme weakness.  Questioning if she's having a MS Relapse and also if IV Steroids would be beneficial.  Please call and advise.

## 2014-04-11 NOTE — Telephone Encounter (Signed)
I called the patient. Her husband states that she is napping. He states that she will call back in the morning.

## 2014-04-11 NOTE — Telephone Encounter (Signed)
I called the patient. She states that she is having ongoing trouble with her mobility and weakness in the legs. She also complains that she is hurting all over. She is unsure if this is a MS relapse or related to her fibromyalgia. I consulted with Dr. Jannifer Franklin. At this time we'll start the  patient on prednisone 5 mg daily. If she does not find this beneficial after a couple weeks she she'll let us know. We will have the patient come back in for revisit at that time.

## 2014-04-12 DIAGNOSIS — R260 Ataxic gait: Secondary | ICD-10-CM | POA: Diagnosis not present

## 2014-04-12 DIAGNOSIS — R296 Repeated falls: Secondary | ICD-10-CM | POA: Diagnosis not present

## 2014-04-12 DIAGNOSIS — M545 Low back pain: Secondary | ICD-10-CM | POA: Diagnosis not present

## 2014-04-13 DIAGNOSIS — M85852 Other specified disorders of bone density and structure, left thigh: Secondary | ICD-10-CM | POA: Diagnosis not present

## 2014-04-13 DIAGNOSIS — Z780 Asymptomatic menopausal state: Secondary | ICD-10-CM | POA: Diagnosis not present

## 2014-04-13 DIAGNOSIS — Z7952 Long term (current) use of systemic steroids: Secondary | ICD-10-CM | POA: Diagnosis not present

## 2014-04-13 DIAGNOSIS — M1990 Unspecified osteoarthritis, unspecified site: Secondary | ICD-10-CM | POA: Diagnosis not present

## 2014-04-13 DIAGNOSIS — Z79899 Other long term (current) drug therapy: Secondary | ICD-10-CM | POA: Diagnosis not present

## 2014-04-13 DIAGNOSIS — M810 Age-related osteoporosis without current pathological fracture: Secondary | ICD-10-CM | POA: Diagnosis not present

## 2014-04-13 DIAGNOSIS — M8589 Other specified disorders of bone density and structure, multiple sites: Secondary | ICD-10-CM | POA: Diagnosis not present

## 2014-04-13 DIAGNOSIS — M85851 Other specified disorders of bone density and structure, right thigh: Secondary | ICD-10-CM | POA: Diagnosis not present

## 2014-04-13 DIAGNOSIS — G35 Multiple sclerosis: Secondary | ICD-10-CM | POA: Diagnosis not present

## 2014-04-13 DIAGNOSIS — M8588 Other specified disorders of bone density and structure, other site: Secondary | ICD-10-CM | POA: Diagnosis not present

## 2014-04-18 DIAGNOSIS — R296 Repeated falls: Secondary | ICD-10-CM | POA: Diagnosis not present

## 2014-04-18 DIAGNOSIS — M545 Low back pain: Secondary | ICD-10-CM | POA: Diagnosis not present

## 2014-04-18 DIAGNOSIS — R260 Ataxic gait: Secondary | ICD-10-CM | POA: Diagnosis not present

## 2014-04-19 DIAGNOSIS — R296 Repeated falls: Secondary | ICD-10-CM | POA: Diagnosis not present

## 2014-04-19 DIAGNOSIS — R260 Ataxic gait: Secondary | ICD-10-CM | POA: Diagnosis not present

## 2014-04-19 DIAGNOSIS — M545 Low back pain: Secondary | ICD-10-CM | POA: Diagnosis not present

## 2014-04-24 DIAGNOSIS — R260 Ataxic gait: Secondary | ICD-10-CM | POA: Diagnosis not present

## 2014-04-24 DIAGNOSIS — M545 Low back pain: Secondary | ICD-10-CM | POA: Diagnosis not present

## 2014-04-24 DIAGNOSIS — R296 Repeated falls: Secondary | ICD-10-CM | POA: Diagnosis not present

## 2014-04-26 DIAGNOSIS — M545 Low back pain: Secondary | ICD-10-CM | POA: Diagnosis not present

## 2014-04-26 DIAGNOSIS — R260 Ataxic gait: Secondary | ICD-10-CM | POA: Diagnosis not present

## 2014-04-26 DIAGNOSIS — R296 Repeated falls: Secondary | ICD-10-CM | POA: Diagnosis not present

## 2014-05-01 DIAGNOSIS — M545 Low back pain: Secondary | ICD-10-CM | POA: Diagnosis not present

## 2014-05-01 DIAGNOSIS — R296 Repeated falls: Secondary | ICD-10-CM | POA: Diagnosis not present

## 2014-05-01 DIAGNOSIS — R260 Ataxic gait: Secondary | ICD-10-CM | POA: Diagnosis not present

## 2014-05-04 DIAGNOSIS — R260 Ataxic gait: Secondary | ICD-10-CM | POA: Diagnosis not present

## 2014-05-04 DIAGNOSIS — R296 Repeated falls: Secondary | ICD-10-CM | POA: Diagnosis not present

## 2014-05-04 DIAGNOSIS — M545 Low back pain: Secondary | ICD-10-CM | POA: Diagnosis not present

## 2014-05-05 DIAGNOSIS — M545 Low back pain: Secondary | ICD-10-CM | POA: Diagnosis not present

## 2014-05-05 DIAGNOSIS — R296 Repeated falls: Secondary | ICD-10-CM | POA: Diagnosis not present

## 2014-05-05 DIAGNOSIS — R260 Ataxic gait: Secondary | ICD-10-CM | POA: Diagnosis not present

## 2014-05-08 DIAGNOSIS — R296 Repeated falls: Secondary | ICD-10-CM | POA: Diagnosis not present

## 2014-05-08 DIAGNOSIS — R260 Ataxic gait: Secondary | ICD-10-CM | POA: Diagnosis not present

## 2014-05-08 DIAGNOSIS — M545 Low back pain: Secondary | ICD-10-CM | POA: Diagnosis not present

## 2014-05-10 DIAGNOSIS — R260 Ataxic gait: Secondary | ICD-10-CM | POA: Diagnosis not present

## 2014-05-10 DIAGNOSIS — M545 Low back pain: Secondary | ICD-10-CM | POA: Diagnosis not present

## 2014-05-10 DIAGNOSIS — R296 Repeated falls: Secondary | ICD-10-CM | POA: Diagnosis not present

## 2014-05-13 DIAGNOSIS — M542 Cervicalgia: Secondary | ICD-10-CM | POA: Diagnosis not present

## 2014-05-13 DIAGNOSIS — M545 Low back pain: Secondary | ICD-10-CM | POA: Diagnosis not present

## 2014-05-15 DIAGNOSIS — M545 Low back pain: Secondary | ICD-10-CM | POA: Diagnosis not present

## 2014-05-15 DIAGNOSIS — M47812 Spondylosis without myelopathy or radiculopathy, cervical region: Secondary | ICD-10-CM | POA: Diagnosis not present

## 2014-05-15 DIAGNOSIS — S3992XA Unspecified injury of lower back, initial encounter: Secondary | ICD-10-CM | POA: Diagnosis not present

## 2014-05-15 DIAGNOSIS — M8588 Other specified disorders of bone density and structure, other site: Secondary | ICD-10-CM | POA: Diagnosis not present

## 2014-05-15 DIAGNOSIS — I998 Other disorder of circulatory system: Secondary | ICD-10-CM | POA: Diagnosis not present

## 2014-05-15 DIAGNOSIS — M5030 Other cervical disc degeneration, unspecified cervical region: Secondary | ICD-10-CM | POA: Diagnosis not present

## 2014-05-15 DIAGNOSIS — M4312 Spondylolisthesis, cervical region: Secondary | ICD-10-CM | POA: Diagnosis not present

## 2014-05-17 DIAGNOSIS — M545 Low back pain: Secondary | ICD-10-CM | POA: Diagnosis not present

## 2014-05-17 DIAGNOSIS — R260 Ataxic gait: Secondary | ICD-10-CM | POA: Diagnosis not present

## 2014-05-17 DIAGNOSIS — R296 Repeated falls: Secondary | ICD-10-CM | POA: Diagnosis not present

## 2014-05-19 DIAGNOSIS — R260 Ataxic gait: Secondary | ICD-10-CM | POA: Diagnosis not present

## 2014-05-19 DIAGNOSIS — R296 Repeated falls: Secondary | ICD-10-CM | POA: Diagnosis not present

## 2014-05-19 DIAGNOSIS — M545 Low back pain: Secondary | ICD-10-CM | POA: Diagnosis not present

## 2014-05-22 DIAGNOSIS — R260 Ataxic gait: Secondary | ICD-10-CM | POA: Diagnosis not present

## 2014-05-22 DIAGNOSIS — R296 Repeated falls: Secondary | ICD-10-CM | POA: Diagnosis not present

## 2014-05-22 DIAGNOSIS — M545 Low back pain: Secondary | ICD-10-CM | POA: Diagnosis not present

## 2014-05-25 DIAGNOSIS — R296 Repeated falls: Secondary | ICD-10-CM | POA: Diagnosis not present

## 2014-05-25 DIAGNOSIS — R260 Ataxic gait: Secondary | ICD-10-CM | POA: Diagnosis not present

## 2014-05-25 DIAGNOSIS — M545 Low back pain: Secondary | ICD-10-CM | POA: Diagnosis not present

## 2014-05-29 DIAGNOSIS — M545 Low back pain: Secondary | ICD-10-CM | POA: Diagnosis not present

## 2014-05-29 DIAGNOSIS — R296 Repeated falls: Secondary | ICD-10-CM | POA: Diagnosis not present

## 2014-05-29 DIAGNOSIS — R260 Ataxic gait: Secondary | ICD-10-CM | POA: Diagnosis not present

## 2014-06-01 DIAGNOSIS — R260 Ataxic gait: Secondary | ICD-10-CM | POA: Diagnosis not present

## 2014-06-01 DIAGNOSIS — R296 Repeated falls: Secondary | ICD-10-CM | POA: Diagnosis not present

## 2014-06-01 DIAGNOSIS — M545 Low back pain: Secondary | ICD-10-CM | POA: Diagnosis not present

## 2014-06-05 DIAGNOSIS — H04123 Dry eye syndrome of bilateral lacrimal glands: Secondary | ICD-10-CM | POA: Diagnosis not present

## 2014-06-05 DIAGNOSIS — H5319 Other subjective visual disturbances: Secondary | ICD-10-CM | POA: Diagnosis not present

## 2014-06-06 DIAGNOSIS — R260 Ataxic gait: Secondary | ICD-10-CM | POA: Diagnosis not present

## 2014-06-06 DIAGNOSIS — R296 Repeated falls: Secondary | ICD-10-CM | POA: Diagnosis not present

## 2014-06-06 DIAGNOSIS — M545 Low back pain: Secondary | ICD-10-CM | POA: Diagnosis not present

## 2014-06-08 DIAGNOSIS — R296 Repeated falls: Secondary | ICD-10-CM | POA: Diagnosis not present

## 2014-06-08 DIAGNOSIS — R260 Ataxic gait: Secondary | ICD-10-CM | POA: Diagnosis not present

## 2014-06-08 DIAGNOSIS — M545 Low back pain: Secondary | ICD-10-CM | POA: Diagnosis not present

## 2014-06-12 DIAGNOSIS — M545 Low back pain: Secondary | ICD-10-CM | POA: Diagnosis not present

## 2014-06-12 DIAGNOSIS — R296 Repeated falls: Secondary | ICD-10-CM | POA: Diagnosis not present

## 2014-06-12 DIAGNOSIS — R260 Ataxic gait: Secondary | ICD-10-CM | POA: Diagnosis not present

## 2014-06-16 ENCOUNTER — Telehealth: Payer: Self-pay | Admitting: Neurology

## 2014-06-16 NOTE — Telephone Encounter (Signed)
Patient needs a new appointment, sensory symptoms , has been " sitting in hot oil",  since cutting back on cymbalta. She is having a prednisne dose pack at home, can she take it ?  I encouraged her to do that.   Would like Dr Jannifer Franklin, to see him in a RV .

## 2014-06-16 NOTE — Telephone Encounter (Signed)
I called patient. The patient has been off of prednisone for about 2 months. She stopped Cymbalta because it was making her irritable 4 weeks ago. 3 weeks ago, she began having increased burning pains in the feet and legs. This is likely secondary to the recent withdrawal from the Cymbalta. We will consider use of another medication such as nortriptyline or amitriptyline. I will set a revisit up for this patient.

## 2014-06-19 NOTE — Telephone Encounter (Signed)
Appointment scheduled for 5/12 at 12 PM.

## 2014-06-21 DIAGNOSIS — R260 Ataxic gait: Secondary | ICD-10-CM | POA: Diagnosis not present

## 2014-06-21 DIAGNOSIS — R296 Repeated falls: Secondary | ICD-10-CM | POA: Diagnosis not present

## 2014-06-21 DIAGNOSIS — M545 Low back pain: Secondary | ICD-10-CM | POA: Diagnosis not present

## 2014-06-22 ENCOUNTER — Telehealth: Payer: Self-pay

## 2014-06-22 ENCOUNTER — Ambulatory Visit: Payer: Self-pay | Admitting: Neurology

## 2014-06-22 NOTE — Telephone Encounter (Signed)
Patient called the morning of her appointment to cancel d/t lack of transportation.

## 2014-06-23 DIAGNOSIS — R260 Ataxic gait: Secondary | ICD-10-CM | POA: Diagnosis not present

## 2014-06-23 DIAGNOSIS — R296 Repeated falls: Secondary | ICD-10-CM | POA: Diagnosis not present

## 2014-06-23 DIAGNOSIS — M545 Low back pain: Secondary | ICD-10-CM | POA: Diagnosis not present

## 2014-06-26 ENCOUNTER — Encounter: Payer: Self-pay | Admitting: Neurology

## 2014-06-27 DIAGNOSIS — M545 Low back pain: Secondary | ICD-10-CM | POA: Diagnosis not present

## 2014-06-27 DIAGNOSIS — R260 Ataxic gait: Secondary | ICD-10-CM | POA: Diagnosis not present

## 2014-06-27 DIAGNOSIS — R296 Repeated falls: Secondary | ICD-10-CM | POA: Diagnosis not present

## 2014-06-30 DIAGNOSIS — R296 Repeated falls: Secondary | ICD-10-CM | POA: Diagnosis not present

## 2014-06-30 DIAGNOSIS — M545 Low back pain: Secondary | ICD-10-CM | POA: Diagnosis not present

## 2014-06-30 DIAGNOSIS — R260 Ataxic gait: Secondary | ICD-10-CM | POA: Diagnosis not present

## 2014-07-03 DIAGNOSIS — M545 Low back pain: Secondary | ICD-10-CM | POA: Diagnosis not present

## 2014-07-03 DIAGNOSIS — R296 Repeated falls: Secondary | ICD-10-CM | POA: Diagnosis not present

## 2014-07-03 DIAGNOSIS — R260 Ataxic gait: Secondary | ICD-10-CM | POA: Diagnosis not present

## 2014-07-05 ENCOUNTER — Ambulatory Visit (INDEPENDENT_AMBULATORY_CARE_PROVIDER_SITE_OTHER): Payer: Medicare Other | Admitting: Neurology

## 2014-07-05 ENCOUNTER — Encounter: Payer: Self-pay | Admitting: Neurology

## 2014-07-05 VITALS — BP 147/62 | HR 54 | Ht 66.0 in | Wt 161.8 lb

## 2014-07-05 DIAGNOSIS — G35 Multiple sclerosis: Secondary | ICD-10-CM | POA: Diagnosis not present

## 2014-07-05 DIAGNOSIS — N319 Neuromuscular dysfunction of bladder, unspecified: Secondary | ICD-10-CM

## 2014-07-05 DIAGNOSIS — R269 Unspecified abnormalities of gait and mobility: Secondary | ICD-10-CM

## 2014-07-05 NOTE — Progress Notes (Signed)
Reason for visit: Multiple sclerosis  Stephanie Perez is an 74 y.o. female  History of present illness:  Ms. Valenza is a 74 year old right-handed white female with history of multiple sclerosis. The patient has undergone physical therapy which has helped immensely with the gait stability. She has gone on low-dose prednisone taking 5 mg daily, and this has significantly improved her ability to function and her sense of well-being has improved. The patient has not had any falls since last seen. She was falling frequently before then. She uses a walker for ambulation, and for long-distance travel, she has a motorized wheelchair. She has some problems with alternating constipation and diarrhea, she will take MiraLAX for this. She returns for an evaluation. She is not otherwise on any disease modifying medications for her multiple sclerosis.  Past Medical History  Diagnosis Date  . Multiple sclerosis   . Spastic paraparesis   . Depression   . Anxiety   . Fibromyalgia   . Rheumatoid arteritis   . Neurogenic bladder   . Abnormality of gait   . Lumbago     Past Surgical History  Procedure Laterality Date  . Abdominal hysterectomy    . Appendectomy    . Gallbladder surgery N/A   . Fracture surgery      Family History  Problem Relation Age of Onset  . Lung cancer Mother   . Heart disease Father   . Other Father   . Multiple sclerosis Brother     Social history:  reports that she has never smoked. She has never used smokeless tobacco. She reports that she drinks alcohol. She reports that she does not use illicit drugs.    Allergies  Allergen Reactions  . Bactrim [Sulfamethoxazole-Trimethoprim]   . Codeine Sulfate   . Lyrica [Pregabalin]   . Zanaflex [Tizanidine Hcl]   . Sulfa Antibiotics     Medications:  Prior to Admission medications   Medication Sig Start Date End Date Taking? Authorizing Provider  aspirin 81 MG tablet Take 81 mg by mouth daily.   Yes Historical  Provider, MD  clonazePAM (KLONOPIN) 0.5 MG tablet Take 0.5 mg by mouth at bedtime as needed for anxiety.   Yes Historical Provider, MD  diazepam (VALIUM) 2 MG tablet Take 1 tablet (2 mg total) by mouth 3 (three) times daily. 10/19/13  Yes Kathrynn Ducking, MD  metoprolol tartrate (LOPRESSOR) 25 MG tablet  07/14/13  Yes Historical Provider, MD  omeprazole (PRILOSEC) 20 MG capsule Take 1 capsule (20 mg total) by mouth daily. 05/19/12  Yes Kathrynn Ducking, MD  predniSONE (DELTASONE) 5 MG tablet Take 1 tablet (5 mg total) by mouth daily with breakfast. 04/11/14  Yes Ward Givens, NP  traMADol (ULTRAM) 50 MG tablet Take 50 mg by mouth every 6 (six) hours as needed for pain.   Yes Historical Provider, MD  valsartan-hydrochlorothiazide (DIOVAN-HCT) 160-12.5 MG per tablet 1 tablet every morning. 08/13/13  Yes Historical Provider, MD  venlafaxine XR (EFFEXOR-XR) 150 MG 24 hr capsule Take 150 mg by mouth at bedtime. 06/15/14  Yes Historical Provider, MD    ROS:  Out of a complete 14 system review of symptoms, the patient complains only of the following symptoms, and all other reviewed systems are negative.  Excessive sweating Heat intolerance Constipation, diarrhea, nausea, incontinence of bowels Restless legs Inconstant bladder, frequency of urination Walking difficulty Weakness Depression  Blood pressure 147/62, pulse 54, height 5\' 6"  (1.676 m), weight 161 lb 12.8 oz (73.392 kg).  Physical Exam  General: The patient is alert and cooperative at the time of the examination.  Skin: No significant peripheral edema is noted.   Neurologic Exam  Mental status: The patient is alert and oriented x 3 at the time of the examination. The patient has apparent normal recent and remote memory, with an apparently normal attention span and concentration ability.   Cranial nerves: Facial symmetry is present. Speech is normal, no aphasia or dysarthria is noted. Extraocular movements are full. Visual fields are  full.  Motor: The patient has good strength in all 4 extremities, with exception of 4 minus/5 strength proximally in both legs, and 4/5 strength with dorsiflexion of the feet bilaterally.  Sensory examination: Soft touch sensation is symmetric on the face, arms, and legs.  Coordination: The patient has good finger-nose-finger bilaterally. The patient has difficulty performing heel-to-shin on both sides.  Gait and station: The patient has a wide-based, unsteady gait. The patient uses a walker for ambulation. Can gait was not attempted.  Reflexes: Deep tendon reflexes are symmetric.   Assessment/Plan:  1. Multiple sclerosis  2. Gait disorder  3. Neurogenic bladder  The patient will continue her physical therapy. I would wonder if the patient could benefit from AFO braces given the fact that she has hip flexion weakness and foot drops on both sides. The left leg is slightly weaker than the right. The patient will continue the prednisone taking 5 mg daily as this has made quite a bit of difference in her functional level. The patient will follow-up in about 6 months.  Jill Alexanders MD 07/05/2014 8:41 PM  [facility] [address] [place], [place] [address]  Phone [phone] Fax [phone]

## 2014-07-05 NOTE — Patient Instructions (Signed)

## 2014-07-06 DIAGNOSIS — H6690 Otitis media, unspecified, unspecified ear: Secondary | ICD-10-CM | POA: Diagnosis not present

## 2014-07-06 DIAGNOSIS — J029 Acute pharyngitis, unspecified: Secondary | ICD-10-CM | POA: Diagnosis not present

## 2014-07-06 DIAGNOSIS — J329 Chronic sinusitis, unspecified: Secondary | ICD-10-CM | POA: Diagnosis not present

## 2014-07-06 DIAGNOSIS — R05 Cough: Secondary | ICD-10-CM | POA: Diagnosis not present

## 2014-07-07 DIAGNOSIS — H10022 Other mucopurulent conjunctivitis, left eye: Secondary | ICD-10-CM | POA: Diagnosis not present

## 2014-07-18 DIAGNOSIS — M797 Fibromyalgia: Secondary | ICD-10-CM | POA: Diagnosis not present

## 2014-07-18 DIAGNOSIS — M05419 Rheumatoid myopathy with rheumatoid arthritis of unspecified shoulder: Secondary | ICD-10-CM | POA: Diagnosis not present

## 2014-07-18 DIAGNOSIS — I10 Essential (primary) hypertension: Secondary | ICD-10-CM | POA: Diagnosis not present

## 2014-07-18 DIAGNOSIS — M545 Low back pain: Secondary | ICD-10-CM | POA: Diagnosis not present

## 2014-07-18 DIAGNOSIS — M810 Age-related osteoporosis without current pathological fracture: Secondary | ICD-10-CM | POA: Diagnosis not present

## 2014-07-18 DIAGNOSIS — G35 Multiple sclerosis: Secondary | ICD-10-CM | POA: Diagnosis not present

## 2014-07-18 DIAGNOSIS — E782 Mixed hyperlipidemia: Secondary | ICD-10-CM | POA: Diagnosis not present

## 2014-07-19 DIAGNOSIS — R260 Ataxic gait: Secondary | ICD-10-CM | POA: Diagnosis not present

## 2014-07-19 DIAGNOSIS — R296 Repeated falls: Secondary | ICD-10-CM | POA: Diagnosis not present

## 2014-07-19 DIAGNOSIS — M545 Low back pain: Secondary | ICD-10-CM | POA: Diagnosis not present

## 2014-07-21 DIAGNOSIS — H1089 Other conjunctivitis: Secondary | ICD-10-CM | POA: Diagnosis not present

## 2014-07-21 DIAGNOSIS — H5319 Other subjective visual disturbances: Secondary | ICD-10-CM | POA: Diagnosis not present

## 2014-07-21 DIAGNOSIS — H04123 Dry eye syndrome of bilateral lacrimal glands: Secondary | ICD-10-CM | POA: Diagnosis not present

## 2014-07-25 DIAGNOSIS — M545 Low back pain: Secondary | ICD-10-CM | POA: Diagnosis not present

## 2014-07-25 DIAGNOSIS — R296 Repeated falls: Secondary | ICD-10-CM | POA: Diagnosis not present

## 2014-07-25 DIAGNOSIS — R260 Ataxic gait: Secondary | ICD-10-CM | POA: Diagnosis not present

## 2014-07-27 DIAGNOSIS — M545 Low back pain: Secondary | ICD-10-CM | POA: Diagnosis not present

## 2014-07-27 DIAGNOSIS — R260 Ataxic gait: Secondary | ICD-10-CM | POA: Diagnosis not present

## 2014-07-27 DIAGNOSIS — R296 Repeated falls: Secondary | ICD-10-CM | POA: Diagnosis not present

## 2014-07-29 DIAGNOSIS — J029 Acute pharyngitis, unspecified: Secondary | ICD-10-CM | POA: Diagnosis not present

## 2014-08-01 DIAGNOSIS — R296 Repeated falls: Secondary | ICD-10-CM | POA: Diagnosis not present

## 2014-08-01 DIAGNOSIS — M545 Low back pain: Secondary | ICD-10-CM | POA: Diagnosis not present

## 2014-08-01 DIAGNOSIS — R260 Ataxic gait: Secondary | ICD-10-CM | POA: Diagnosis not present

## 2014-08-03 DIAGNOSIS — R296 Repeated falls: Secondary | ICD-10-CM | POA: Diagnosis not present

## 2014-08-03 DIAGNOSIS — M545 Low back pain: Secondary | ICD-10-CM | POA: Diagnosis not present

## 2014-08-03 DIAGNOSIS — R260 Ataxic gait: Secondary | ICD-10-CM | POA: Diagnosis not present

## 2014-08-09 DIAGNOSIS — R296 Repeated falls: Secondary | ICD-10-CM | POA: Diagnosis not present

## 2014-08-09 DIAGNOSIS — M545 Low back pain: Secondary | ICD-10-CM | POA: Diagnosis not present

## 2014-08-09 DIAGNOSIS — R260 Ataxic gait: Secondary | ICD-10-CM | POA: Diagnosis not present

## 2014-08-11 DIAGNOSIS — M25561 Pain in right knee: Secondary | ICD-10-CM | POA: Diagnosis not present

## 2014-08-21 DIAGNOSIS — S83411A Sprain of medial collateral ligament of right knee, initial encounter: Secondary | ICD-10-CM | POA: Diagnosis not present

## 2014-08-21 DIAGNOSIS — M1711 Unilateral primary osteoarthritis, right knee: Secondary | ICD-10-CM | POA: Diagnosis not present

## 2014-08-25 DIAGNOSIS — M810 Age-related osteoporosis without current pathological fracture: Secondary | ICD-10-CM | POA: Diagnosis not present

## 2014-08-28 DIAGNOSIS — N3941 Urge incontinence: Secondary | ICD-10-CM | POA: Diagnosis not present

## 2014-08-28 DIAGNOSIS — G35 Multiple sclerosis: Secondary | ICD-10-CM | POA: Diagnosis not present

## 2014-08-30 NOTE — Patient Outreach (Signed)
[facility]) Care Management  08/30/2014  Stephanie Perez Dec 14, 1940 [id number]   Referral from MD with notes, assigned Jacqlyn Larsen, RN to outreach.  [name]. [place], [place] Management [place] Assistant Phone: [phone] Fax: [phone]

## 2014-09-01 DIAGNOSIS — S83231A Complex tear of medial meniscus, current injury, right knee, initial encounter: Secondary | ICD-10-CM | POA: Diagnosis not present

## 2014-09-01 DIAGNOSIS — M25561 Pain in right knee: Secondary | ICD-10-CM | POA: Diagnosis not present

## 2014-09-04 ENCOUNTER — Other Ambulatory Visit: Payer: Self-pay | Admitting: *Deleted

## 2014-09-04 ENCOUNTER — Encounter: Payer: Self-pay | Admitting: *Deleted

## 2014-09-04 NOTE — Patient Outreach (Signed)
09/04/14- Referral received from primary MD through data analysis, telephone call to patient, HIPAA verified, [facility] program explained, pt states she lives in [place] and travels to [place] to see her primary MD and her car "has been acting up" but she has son and sister helping her with transportation, pt has medications and able to afford at present, pt says she is having surgery in the near future as she has torn cartilage and ask about CNA to assist her after the surgery, RN CM suggested home health referral after the surgery for CNA to assist with bathing, RN and PT if needed, pt will suggest to her surgeon and primary MD. Pt states " I've got MS and know how to deal with this, I've had this a long time"  Pt states she has no needs telephonically at present and refuses services, pt states she cannot talk further, call ended.  Case closed, faxed letter to Dr. Quillian Quince informing pt refuses services.  Jacqlyn Larsen [facility], [place] Coordinator [phone]

## 2014-09-06 DIAGNOSIS — G35 Multiple sclerosis: Secondary | ICD-10-CM | POA: Diagnosis not present

## 2014-09-06 DIAGNOSIS — M25561 Pain in right knee: Secondary | ICD-10-CM | POA: Diagnosis not present

## 2014-09-06 DIAGNOSIS — S8991XA Unspecified injury of right lower leg, initial encounter: Secondary | ICD-10-CM | POA: Diagnosis not present

## 2014-09-06 DIAGNOSIS — S83241A Other tear of medial meniscus, current injury, right knee, initial encounter: Secondary | ICD-10-CM | POA: Diagnosis not present

## 2014-09-06 NOTE — Patient Outreach (Signed)
[facility]) Care Management  09/06/2014  LYLE LEISNER Jun 29, 1940 [id number]   Notification from Jacqlyn Larsen, RN to close case due to patient refused [place] Management services.  [name]. [place], [place] Management [place] Assistant Phone: [phone] Fax: [phone]

## 2014-09-13 ENCOUNTER — Other Ambulatory Visit: Payer: Self-pay | Admitting: Adult Health

## 2014-10-02 DIAGNOSIS — M542 Cervicalgia: Secondary | ICD-10-CM | POA: Diagnosis not present

## 2014-10-02 DIAGNOSIS — M5090 Cervical disc disorder, unspecified, unspecified cervical region: Secondary | ICD-10-CM | POA: Diagnosis not present

## 2014-10-02 DIAGNOSIS — M5442 Lumbago with sciatica, left side: Secondary | ICD-10-CM | POA: Diagnosis not present

## 2014-10-02 DIAGNOSIS — M5134 Other intervertebral disc degeneration, thoracic region: Secondary | ICD-10-CM | POA: Diagnosis not present

## 2014-10-05 DIAGNOSIS — R2689 Other abnormalities of gait and mobility: Secondary | ICD-10-CM | POA: Diagnosis not present

## 2014-10-05 DIAGNOSIS — M542 Cervicalgia: Secondary | ICD-10-CM | POA: Diagnosis not present

## 2014-10-05 DIAGNOSIS — M5442 Lumbago with sciatica, left side: Secondary | ICD-10-CM | POA: Diagnosis not present

## 2014-10-13 DIAGNOSIS — R2689 Other abnormalities of gait and mobility: Secondary | ICD-10-CM | POA: Diagnosis not present

## 2014-10-13 DIAGNOSIS — M542 Cervicalgia: Secondary | ICD-10-CM | POA: Diagnosis not present

## 2014-10-13 DIAGNOSIS — M5442 Lumbago with sciatica, left side: Secondary | ICD-10-CM | POA: Diagnosis not present

## 2014-10-19 DIAGNOSIS — N3941 Urge incontinence: Secondary | ICD-10-CM | POA: Diagnosis not present

## 2014-10-19 DIAGNOSIS — G35 Multiple sclerosis: Secondary | ICD-10-CM | POA: Diagnosis not present

## 2014-10-20 DIAGNOSIS — M542 Cervicalgia: Secondary | ICD-10-CM | POA: Diagnosis not present

## 2014-10-20 DIAGNOSIS — M5442 Lumbago with sciatica, left side: Secondary | ICD-10-CM | POA: Diagnosis not present

## 2014-10-20 DIAGNOSIS — R2689 Other abnormalities of gait and mobility: Secondary | ICD-10-CM | POA: Diagnosis not present

## 2014-10-23 DIAGNOSIS — I10 Essential (primary) hypertension: Secondary | ICD-10-CM | POA: Diagnosis not present

## 2014-10-23 DIAGNOSIS — M25532 Pain in left wrist: Secondary | ICD-10-CM | POA: Diagnosis not present

## 2014-10-23 DIAGNOSIS — E782 Mixed hyperlipidemia: Secondary | ICD-10-CM | POA: Diagnosis not present

## 2014-10-23 DIAGNOSIS — R5383 Other fatigue: Secondary | ICD-10-CM | POA: Diagnosis not present

## 2014-10-25 ENCOUNTER — Telehealth: Payer: Self-pay | Admitting: Neurology

## 2014-10-25 NOTE — Telephone Encounter (Signed)
Pt called and needs predniSONE (DELTASONE) 5 MG tablet. Pt states that she is falling a lot as well. Started falling more in June.  She has had several trips to the hospital.Please call and advise [phone]

## 2014-10-25 NOTE — Telephone Encounter (Signed)
I called the patient. She stated that since June she has been falling more frequently. She believes it is related to the heat "taking everything out of her." She has had 4 falls since August 23. She c/o her back and legs hurting. She was going to physical therapy and it was helping but she states she ran out of visits that her insurance would pay for. I explained that her Prednisone should have refills left and that she should check with her pharmacy. She verbalized understanding.

## 2014-10-25 NOTE — Telephone Encounter (Signed)
I called patient. The patient has had increasing difficulty walking throughout the summer. She is concerned that the heat has worsened issue, I will try to get her into the office for an evaluation.

## 2014-10-26 NOTE — Telephone Encounter (Signed)
I called and spoke to the patient. Appointment schedule 9/20 at 8 AM. The patient states she is feeling worse today. I advised if she continues to feel worse and feels like she needs to go to the ED, then she should do that. She stated she did not feel that was necessary at this time and verbalized understanding.

## 2014-10-30 DIAGNOSIS — M5441 Lumbago with sciatica, right side: Secondary | ICD-10-CM | POA: Diagnosis not present

## 2014-10-30 DIAGNOSIS — Z23 Encounter for immunization: Secondary | ICD-10-CM | POA: Diagnosis not present

## 2014-10-30 DIAGNOSIS — I10 Essential (primary) hypertension: Secondary | ICD-10-CM | POA: Diagnosis not present

## 2014-10-30 DIAGNOSIS — M5134 Other intervertebral disc degeneration, thoracic region: Secondary | ICD-10-CM | POA: Diagnosis not present

## 2014-10-30 DIAGNOSIS — G8929 Other chronic pain: Secondary | ICD-10-CM | POA: Diagnosis not present

## 2014-10-30 DIAGNOSIS — M810 Age-related osteoporosis without current pathological fracture: Secondary | ICD-10-CM | POA: Diagnosis not present

## 2014-10-30 DIAGNOSIS — M5442 Lumbago with sciatica, left side: Secondary | ICD-10-CM | POA: Diagnosis not present

## 2014-10-30 DIAGNOSIS — M1711 Unilateral primary osteoarthritis, right knee: Secondary | ICD-10-CM | POA: Diagnosis not present

## 2014-10-30 DIAGNOSIS — D472 Monoclonal gammopathy: Secondary | ICD-10-CM | POA: Diagnosis not present

## 2014-10-30 DIAGNOSIS — G35 Multiple sclerosis: Secondary | ICD-10-CM | POA: Diagnosis not present

## 2014-10-30 DIAGNOSIS — M797 Fibromyalgia: Secondary | ICD-10-CM | POA: Diagnosis not present

## 2014-10-30 DIAGNOSIS — M05419 Rheumatoid myopathy with rheumatoid arthritis of unspecified shoulder: Secondary | ICD-10-CM | POA: Diagnosis not present

## 2014-10-31 ENCOUNTER — Ambulatory Visit (INDEPENDENT_AMBULATORY_CARE_PROVIDER_SITE_OTHER): Payer: Medicare Other | Admitting: Neurology

## 2014-10-31 ENCOUNTER — Encounter: Payer: Self-pay | Admitting: Neurology

## 2014-10-31 VITALS — BP 161/77 | HR 74 | Ht 66.0 in

## 2014-10-31 DIAGNOSIS — M545 Low back pain, unspecified: Secondary | ICD-10-CM

## 2014-10-31 DIAGNOSIS — G35 Multiple sclerosis: Secondary | ICD-10-CM

## 2014-10-31 DIAGNOSIS — N319 Neuromuscular dysfunction of bladder, unspecified: Secondary | ICD-10-CM | POA: Diagnosis not present

## 2014-10-31 DIAGNOSIS — R269 Unspecified abnormalities of gait and mobility: Secondary | ICD-10-CM | POA: Diagnosis not present

## 2014-10-31 HISTORY — DX: Low back pain, unspecified: M54.50

## 2014-10-31 MED ORDER — PREDNISONE 5 MG PO TABS: 7.5000 mg | ORAL_TABLET | Freq: Every day | ORAL | Status: DC

## 2014-10-31 NOTE — Progress Notes (Signed)
Reason for visit: Multiple sclerosis  Stephanie Perez is an 74 y.o. female  History of present illness:  Stephanie Perez is a 3 year old right-handed white female with a history of a chronic progressive course of multiple sclerosis. The patient is gradually worsening with her ability to ambulate with increased spasticity in both legs. The patient was seen back in May 2016, she indicated that she had done much better with her walking associated with physical therapy. Since that time, the patient has had a significant increase in fatigue and worsening of walking, multiple falls throughout the summer. The patient has had at least 10 falls, she may collapse in the legs, fall backwards, or go to the side. The patient has a walker for ambulation, but even with this she may fall. The leg stiffness has gotten much worse, she is doing a bit better with her urinary incontinence on Myrbetriq. The patient reports a general increase in fatigue. She does take low-dose of prednisone 5 mg daily. She has reported onset of low back pain and some pain into the groin area and down to the thighs. This is worse with weightbearing. She is on diazepam for spasticity, previously she did not tolerate the tizanidine. She is only taking to the 2 mg diazepam tablets during the day. She has also noted some increased weakness in the hands and fingers. She returns for an evaluation.  Past Medical History  Diagnosis Date  . Multiple sclerosis   . Spastic paraparesis   . Depression   . Anxiety   . Fibromyalgia   . Rheumatoid arteritis   . Neurogenic bladder   . Abnormality of gait   . Lumbago   . Low back pain 10/31/2014    Past Surgical History  Procedure Laterality Date  . Abdominal hysterectomy    . Appendectomy    . Gallbladder surgery N/A   . Fracture surgery      Family History  Problem Relation Age of Onset  . Lung cancer Mother   . Heart disease Father   . Other Father   . Multiple sclerosis Brother      Social history:  reports that she has never smoked. She has never used smokeless tobacco. She reports that she does not drink alcohol or use illicit drugs.    Allergies  Allergen Reactions  . Bactrim [Sulfamethoxazole-Trimethoprim]   . Codeine Sulfate   . Lyrica [Pregabalin]   . Zanaflex [Tizanidine Hcl]   . Sulfa Antibiotics     Medications:  Prior to Admission medications   Medication Sig Start Date End Date Taking? Authorizing Provider  aspirin 81 MG tablet Take 81 mg by mouth daily.   Yes Historical Provider, MD  clonazePAM (KLONOPIN) 0.5 MG tablet Take 0.5 mg by mouth at bedtime as needed for anxiety.   Yes Historical Provider, MD  diazepam (VALIUM) 2 MG tablet Take 1 tablet (2 mg total) by mouth 3 (three) times daily. Patient taking differently: Take 2 mg by mouth every 8 (eight) hours as needed.  10/19/13  Yes Kathrynn Ducking, MD  metoprolol tartrate (LOPRESSOR) 25 MG tablet  07/14/13  Yes Historical Provider, MD  mirabegron ER (MYRBETRIQ) 50 MG TB24 tablet Take 50 mg by mouth daily.   Yes Historical Provider, MD  omeprazole (PRILOSEC) 20 MG capsule Take 1 capsule (20 mg total) by mouth daily. 05/19/12  Yes Kathrynn Ducking, MD  predniSONE (DELTASONE) 5 MG tablet TAKE 1 TABLET BY MOUTH EVERY DAY WITH BREAKFAST 09/13/14  Yes  Ward Givens, NP  traMADol (ULTRAM) 50 MG tablet Take 50 mg by mouth every 6 (six) hours as needed for pain.   Yes Historical Provider, MD  valsartan-hydrochlorothiazide (DIOVAN-HCT) 160-12.5 MG per tablet 1 tablet every morning. 08/13/13  Yes Historical Provider, MD  venlafaxine XR (EFFEXOR-XR) 150 MG 24 hr capsule Take 150 mg by mouth at bedtime. 06/15/14  Yes Historical Provider, MD    ROS:  Out of a complete 14 system review of symptoms, the patient complains only of the following symptoms, and all other reviewed systems are negative.  Excessive sweating Incontinence of the bladder Back pain, walking difficulty, neck discomfort Restless legs Weakness,  gait disturbance  Blood pressure 161/77, pulse 74, height 5\' 6"  (1.676 m).  Physical Exam  General: The patient is alert and cooperative at the time of the examination.  Skin: No significant peripheral edema is noted.   Neurologic Exam  Mental status: The patient is alert and oriented x 3 at the time of the examination. The patient has apparent normal recent and remote memory, with an apparently normal attention span and concentration ability.   Cranial nerves: Facial symmetry is present. Speech is normal, no aphasia or dysarthria is noted. Extraocular movements are full. Visual fields are full.  Motor: The patient has good strength in the upper extremities, with the lower extremities there is a significant increase in motor tone bilaterally. The patient has 3/5 strength proximally in the legs, better strength 4+/5 knee extension and flexion..  Sensory examination: Soft touch sensation is symmetric on the face, arms, and legs.  Coordination: The patient has good finger-nose-finger bilaterally. Functionally, the patient is unable to perform heel-to-shin on either side.  Gait and station: The patient requires assistance with standing. Once up, the patient is able to take a few steps with assistance, bilateral circumduction gait is noted. Romberg is unsteady, the patient does not fall  Reflexes: Deep tendon reflexes are symmetric.   Assessment/Plan:  1. Multiple sclerosis, chronic progressive gait disorder  2. Neurogenic bladder  3. Low back pain  The patient is not on any disease modifying agents. She is on low-dose prednisone, she has noted an increase in her overall fatigue level, with a decline in her ability to walk. The prednisone will be increased taking 7.5 mg daily. The patient has just recently completed physical therapy without much benefit. She will go up on the diazepam taking 2 mg 3 times daily, and if this is not beneficial, she is to contact our office, we will add  low-dose baclofen. The patient will undergo MRI of the low back given the recent onset of back pain and pain into the legs associated with weightbearing. She will follow-up in November 2016.  Jill Alexanders MD 10/31/2014 8:38 PM  [facility] [address] [place], [place] [address]  Phone [phone] Fax [phone]

## 2014-10-31 NOTE — Patient Instructions (Addendum)
We will check MRI of the low back, and go back on diazepam 2 mg three times a day. If no benefit with the walking in the next 2 weeks, call and I will add baclofen. We will follow up in November. I will have you go up to 7.5 mg daily instead of 5 mg of the prednisone.   Fall Prevention and Home Safety Falls cause injuries and can affect all age groups. It is possible to use preventive measures to significantly decrease the likelihood of falls. There are many simple measures which can make your home safer and prevent falls. OUTDOORS  Repair cracks and edges of walkways and driveways.  Remove high doorway thresholds.  Trim shrubbery on the main path into your home.  Have good outside lighting.  Clear walkways of tools, rocks, debris, and clutter.  Check that handrails are not broken and are securely fastened. Both sides of steps should have handrails.  Have leaves, snow, and ice cleared regularly.  Use sand or salt on walkways during winter months.  In the garage, clean up grease or oil spills. BATHROOM  Install night lights.  Install grab bars by the toilet and in the tub and shower.  Use non-skid mats or decals in the tub or shower.  Place a plastic non-slip stool in the shower to sit on, if needed.  Keep floors dry and clean up all water on the floor immediately.  Remove soap buildup in the tub or shower on a regular basis.  Secure bath mats with non-slip, double-sided rug tape.  Remove throw rugs and tripping hazards from the floors. BEDROOMS  Install night lights.  Make sure a bedside light is easy to reach.  Do not use oversized bedding.  Keep a telephone by your bedside.  Have a firm chair with side arms to use for getting dressed.  Remove throw rugs and tripping hazards from the floor. KITCHEN  Keep handles on pots and pans turned toward the center of the stove. Use back burners when possible.  Clean up spills quickly and allow time for  drying.  Avoid walking on wet floors.  Avoid hot utensils and knives.  Position shelves so they are not too high or low.  Place commonly used objects within easy reach.  If necessary, use a sturdy step stool with a grab bar when reaching.  Keep electrical cables out of the way.  Do not use floor polish or wax that makes floors slippery. If you must use wax, use non-skid floor wax.  Remove throw rugs and tripping hazards from the floor. STAIRWAYS  Never leave objects on stairs.  Place handrails on both sides of stairways and use them. Fix any loose handrails. Make sure handrails on both sides of the stairways are as long as the stairs.  Check carpeting to make sure it is firmly attached along stairs. Make repairs to worn or loose carpet promptly.  Avoid placing throw rugs at the top or bottom of stairways, or properly secure the rug with carpet tape to prevent slippage. Get rid of throw rugs, if possible.  Have an electrician put in a light switch at the top and bottom of the stairs. OTHER FALL PREVENTION TIPS  Wear low-heel or rubber-soled shoes that are supportive and fit well. Wear closed toe shoes.  When using a stepladder, make sure it is fully opened and both spreaders are firmly locked. Do not climb a closed stepladder.  Add color or contrast paint or tape to grab  bars and handrails in your home. Place contrasting color strips on first and last steps.  Learn and use mobility aids as needed. Install an electrical emergency response system.  Turn on lights to avoid dark areas. Replace light bulbs that burn out immediately. Get light switches that glow.  Arrange furniture to create clear pathways. Keep furniture in the same place.  Firmly attach carpet with non-skid or double-sided tape.  Eliminate uneven floor surfaces.  Select a carpet pattern that does not visually hide the edge of steps.  Be aware of all pets. OTHER HOME SAFETY TIPS  Set the water temperature  for 120 F (48.8 C).  Keep emergency numbers on or near the telephone.  Keep smoke detectors on every level of the home and near sleeping areas. Document Released: 01/17/2002 Document Revised: 07/29/2011 Document Reviewed: 04/18/2011 [facility] Patient Information 2015 [place], [place]. This information is not intended to replace advice given to you by your health care provider. Make sure you discuss any questions you have with your health care provider.

## 2014-11-02 ENCOUNTER — Telehealth: Payer: Self-pay | Admitting: Neurology

## 2014-11-02 DIAGNOSIS — M545 Low back pain: Secondary | ICD-10-CM | POA: Diagnosis not present

## 2014-11-02 DIAGNOSIS — M461 Sacroiliitis, not elsewhere classified: Secondary | ICD-10-CM | POA: Diagnosis not present

## 2014-11-02 NOTE — Telephone Encounter (Signed)
Spoke w/ Stephanie Perez and advised him that pt is scheduled to have her MRI on 9/27 at [facility]. @ [place].  He confirmed the time and date will work and she will be there.

## 2014-11-02 NOTE — Telephone Encounter (Signed)
Husband Stephanie Perez [phone] called to check status of referral for MRI to be done in [place]. Spouse states they haven't heard anything yet.

## 2014-11-06 ENCOUNTER — Telehealth: Payer: Self-pay | Admitting: Adult Health

## 2014-11-06 NOTE — Telephone Encounter (Signed)
[facility] called and needs an order for MRI lumbar spine. The pt is scheduled to arrive for an appt with them tomorrow . Fax [phone] Phone: [phone] ext [phone]. [employer]

## 2014-11-06 NOTE — Telephone Encounter (Signed)
Noted and faxed . To [employer]

## 2014-11-07 DIAGNOSIS — M545 Low back pain: Secondary | ICD-10-CM | POA: Diagnosis not present

## 2014-11-08 ENCOUNTER — Telehealth: Payer: Self-pay | Admitting: Neurology

## 2014-11-08 DIAGNOSIS — G35 Multiple sclerosis: Secondary | ICD-10-CM

## 2014-11-08 MED ORDER — BACLOFEN 10 MG PO TABS: 5.0000 mg | ORAL_TABLET | Freq: Two times a day (BID) | ORAL | Status: DC

## 2014-11-08 NOTE — Telephone Encounter (Signed)
Stephanie Perez with [facility] called stating if PT is approved to please fax demographics and hx with PT script to fax# [phone]. The phone number is [phone].

## 2014-11-08 NOTE — Telephone Encounter (Signed)
Patient called stating she has talked with PCP who gave her phone # to call Medicare. She called Medicare and was told she could have PT again to build balance, strength and flexibility but will need RX from Dr Jannifer Franklin. The valium is making her very sleepy and she is still stiff. She is also talking about baclofen. Please call and advise. Patient can be reached at [phone].

## 2014-11-08 NOTE — Telephone Encounter (Signed)
The patient called. She had MRI evaluation of the low back. 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 This may be related to the multiple sclerosis, but the patient is reporting discomfort in the groin area down the legs with weightbearing. X-rays of the hip need to be done, the patient says she had x-rays done, she will make sure that these were of the hips. The patient is not gaining benefit with spasticity on diazepam, this is making her too drowsy, she will stop the medication, I'll add low-dose baclofen taking 5 mg twice daily. I will get her into physical therapy.

## 2014-11-08 NOTE — Telephone Encounter (Signed)
I called the patient. She stated that she felt like physical therapy helped a lot and according to Medicare she has not used up all of her allotted time for PT. She would like for Dr. Jannifer Franklin to order PT. She stated that ever since increasing the Valium she has been extremely sleepy. Sometimes she takes 3 naps a day. I advised that I would let Dr. Jannifer Franklin know this and call her back.

## 2014-11-15 DIAGNOSIS — M6281 Muscle weakness (generalized): Secondary | ICD-10-CM | POA: Diagnosis not present

## 2014-11-15 DIAGNOSIS — G35 Multiple sclerosis: Secondary | ICD-10-CM | POA: Diagnosis not present

## 2014-11-16 ENCOUNTER — Telehealth: Payer: Self-pay | Admitting: Neurology

## 2014-11-16 NOTE — Telephone Encounter (Signed)
I called the patient. She was very confused about what medications she was supposed to take. I explained that according to Dr. Jannifer Franklin' note, she was to stop the valium and only take 1/2 tablet baclofen twice daily. She was very excited to hear this and stated she would do that.

## 2014-11-16 NOTE — Telephone Encounter (Signed)
Patient called requesting a call back [phone] regarding "valium (makes very,very sleepy) Dr. Jannifer Franklin changed to baclofen, 1/2 tablet. Patient backed off of the valium to 1 per day, takes a whole tablet of baclofen helps muscles of her legs very good and helps pain a little bit".

## 2014-11-17 DIAGNOSIS — G35 Multiple sclerosis: Secondary | ICD-10-CM | POA: Diagnosis not present

## 2014-11-17 DIAGNOSIS — M6281 Muscle weakness (generalized): Secondary | ICD-10-CM | POA: Diagnosis not present

## 2014-11-21 NOTE — Telephone Encounter (Signed)
I called the patient. She stated that she felt very anxious after stopping the valium, but that has gone away now. She c/o having to urinate frequently. She feels like when she urinates she isn't able to void completely. She wanted to verify that she is supposed to take 0.5 tablet of Baclofen twice daily. I confirmed that this is accurate. She thanked me for calling.

## 2014-11-21 NOTE — Telephone Encounter (Addendum)
Pt called to touch base with [facility]. She is doing better on Baclofen, not as sleepy as when she was on Valium. Once she stopped taking valium she had quivering in body and stomach. She states she is urinating more with Baclofen and going a lot during the night. She is in therapy Wednesday and Friday so she won't be available those days to take phone call. She does have a question about baclofen doseage. Please call and advise at [phone]

## 2014-11-24 DIAGNOSIS — M6281 Muscle weakness (generalized): Secondary | ICD-10-CM | POA: Diagnosis not present

## 2014-11-24 DIAGNOSIS — G35 Multiple sclerosis: Secondary | ICD-10-CM | POA: Diagnosis not present

## 2014-11-28 DIAGNOSIS — G35 Multiple sclerosis: Secondary | ICD-10-CM | POA: Diagnosis not present

## 2014-11-28 DIAGNOSIS — M6281 Muscle weakness (generalized): Secondary | ICD-10-CM | POA: Diagnosis not present

## 2014-11-29 DIAGNOSIS — M461 Sacroiliitis, not elsewhere classified: Secondary | ICD-10-CM | POA: Diagnosis not present

## 2014-12-04 DIAGNOSIS — M6281 Muscle weakness (generalized): Secondary | ICD-10-CM | POA: Diagnosis not present

## 2014-12-04 DIAGNOSIS — G35 Multiple sclerosis: Secondary | ICD-10-CM | POA: Diagnosis not present

## 2014-12-06 ENCOUNTER — Encounter: Payer: Self-pay | Admitting: Neurology

## 2014-12-12 DIAGNOSIS — G35 Multiple sclerosis: Secondary | ICD-10-CM | POA: Diagnosis not present

## 2014-12-12 DIAGNOSIS — M6281 Muscle weakness (generalized): Secondary | ICD-10-CM | POA: Diagnosis not present

## 2014-12-13 ENCOUNTER — Telehealth: Payer: Self-pay | Admitting: Adult Health

## 2014-12-13 DIAGNOSIS — R269 Unspecified abnormalities of gait and mobility: Secondary | ICD-10-CM

## 2014-12-13 NOTE — Telephone Encounter (Signed)
Pt called and states that PT is suggesting not using an electric wheel chair. She needs Rx for a walker, with four wheels and seat included. Please call and advise

## 2014-12-13 NOTE — Telephone Encounter (Signed)
Prescription printed and signed. Please call the patient and fax to DME that she prefers

## 2014-12-13 NOTE — Telephone Encounter (Addendum)
Spoke to patient. She requested Rx mailed. Verified address.

## 2014-12-14 DIAGNOSIS — G35 Multiple sclerosis: Secondary | ICD-10-CM | POA: Diagnosis not present

## 2014-12-14 DIAGNOSIS — M6281 Muscle weakness (generalized): Secondary | ICD-10-CM | POA: Diagnosis not present

## 2014-12-19 DIAGNOSIS — M6281 Muscle weakness (generalized): Secondary | ICD-10-CM | POA: Diagnosis not present

## 2014-12-19 DIAGNOSIS — G35 Multiple sclerosis: Secondary | ICD-10-CM | POA: Diagnosis not present

## 2014-12-21 DIAGNOSIS — M6281 Muscle weakness (generalized): Secondary | ICD-10-CM | POA: Diagnosis not present

## 2014-12-21 DIAGNOSIS — G35 Multiple sclerosis: Secondary | ICD-10-CM | POA: Diagnosis not present

## 2014-12-28 DIAGNOSIS — N3941 Urge incontinence: Secondary | ICD-10-CM | POA: Diagnosis not present

## 2014-12-28 DIAGNOSIS — Z719 Counseling, unspecified: Secondary | ICD-10-CM | POA: Diagnosis not present

## 2014-12-28 DIAGNOSIS — K5900 Constipation, unspecified: Secondary | ICD-10-CM | POA: Diagnosis not present

## 2014-12-28 DIAGNOSIS — Z139 Encounter for screening, unspecified: Secondary | ICD-10-CM | POA: Diagnosis not present

## 2014-12-28 DIAGNOSIS — N319 Neuromuscular dysfunction of bladder, unspecified: Secondary | ICD-10-CM | POA: Diagnosis not present

## 2014-12-28 DIAGNOSIS — R351 Nocturia: Secondary | ICD-10-CM | POA: Diagnosis not present

## 2015-01-01 DIAGNOSIS — G35 Multiple sclerosis: Secondary | ICD-10-CM | POA: Diagnosis not present

## 2015-01-01 DIAGNOSIS — M6281 Muscle weakness (generalized): Secondary | ICD-10-CM | POA: Diagnosis not present

## 2015-01-03 DIAGNOSIS — Z01419 Encounter for gynecological examination (general) (routine) without abnormal findings: Secondary | ICD-10-CM | POA: Diagnosis not present

## 2015-01-03 DIAGNOSIS — K5900 Constipation, unspecified: Secondary | ICD-10-CM | POA: Diagnosis not present

## 2015-01-03 DIAGNOSIS — N951 Menopausal and female climacteric states: Secondary | ICD-10-CM | POA: Diagnosis not present

## 2015-01-03 DIAGNOSIS — Z1231 Encounter for screening mammogram for malignant neoplasm of breast: Secondary | ICD-10-CM | POA: Diagnosis not present

## 2015-01-09 ENCOUNTER — Encounter: Payer: Self-pay | Admitting: Adult Health

## 2015-01-09 ENCOUNTER — Ambulatory Visit (INDEPENDENT_AMBULATORY_CARE_PROVIDER_SITE_OTHER): Payer: Medicare Other | Admitting: Adult Health

## 2015-01-09 VITALS — BP 120/69 | HR 60 | Ht 66.0 in | Wt 166.0 lb

## 2015-01-09 DIAGNOSIS — R269 Unspecified abnormalities of gait and mobility: Secondary | ICD-10-CM | POA: Diagnosis not present

## 2015-01-09 DIAGNOSIS — M62838 Other muscle spasm: Secondary | ICD-10-CM

## 2015-01-09 DIAGNOSIS — G35 Multiple sclerosis: Secondary | ICD-10-CM | POA: Diagnosis not present

## 2015-01-09 MED ORDER — PREDNISONE 5 MG PO TABS: 5.0000 mg | ORAL_TABLET | Freq: Every day | ORAL | Status: DC

## 2015-01-09 NOTE — Progress Notes (Signed)
I have read the note, and I agree with the clinical assessment and plan.  Stephanie Perez KEITH   

## 2015-01-09 NOTE — Progress Notes (Addendum)
PATIENT: Stephanie Perez DOB: 28-Jul-1940  REASON FOR VISIT: follow up- multiple sclerosis HISTORY FROM: patient  HISTORY OF PRESENT ILLNESS: Ms Stephanie Perez is a 74 year old female with a history of chronic progressive course of multiple sclerosis. She returns today for follow-up visit. She is not on any disease modifying agents at this time. at the last visit her prednisone was increased to 7.5 mg but she was confused and continue taking 5 mg daily.  Patient reports that that she feels that she is doing well. She states that she continues to use a walker for short distances and will use a motorized chair for longer distances. The patient was also given baclofen for muscle spasticity. She states that she's been using it only on an as-needed basis. She reports that she was having excessive sweating especially at night. Her primary care started her on  Estradial. Patient states that she continues to have some issues with urinating. She is on myrbetriq but felt that that wasn't beneficial. She did see a new urologist who felt that her urinating frequency could be due to constipation. He requested that the patient start using flaxseed as well as fiber. The patient states that she has been doing this but still doesn't have the urge to defecate most times. Overall the patient feels that she is doing better. She denies any new neurological symptoms. She returns today for an evaluation.   HISTORY 10/31/14 (Stephanie Perez): Ms. Stephanie Perez is a 71 year old right-handed white female with a history of a chronic progressive course of multiple sclerosis. The patient is gradually worsening with her ability to ambulate with increased spasticity in both legs. The patient was seen back in May 2016, she indicated that she had done much better with her walking associated with physical therapy. Since that time, the patient has had a significant increase in fatigue and worsening of walking, multiple falls throughout the summer. The patient  has had at least 10 falls, she may collapse in the legs, fall backwards, or go to the side. The patient has a walker for ambulation, but even with this she may fall. The leg stiffness has gotten much worse, she is doing a bit better with her urinary incontinence on Myrbetriq. The patient reports a general increase in fatigue. She does take low-dose of prednisone 5 mg daily. She has reported onset of low back pain and some pain into the groin area and down to the thighs. This is worse with weightbearing. She is on diazepam for spasticity, previously she did not tolerate the tizanidine. She is only taking to the 2 mg diazepam tablets during the day. She has also noted some increased weakness in the hands and fingers. She returns for an evaluation.  REVIEW OF SYSTEMS: Out of a complete 14 system review of symptoms, the patient complains only of the following symptoms, and all other reviewed systems are negative.  Rectal bleeding, constipation, diarrhea, excessive sweating, walking difficulty, incontinence of bladder  ALLERGIES: Allergies  Allergen Reactions  . Bactrim [Sulfamethoxazole-Trimethoprim]   . Codeine Sulfate   . Lyrica [Pregabalin]   . Zanaflex [Tizanidine Hcl]   . Sulfa Antibiotics     HOME MEDICATIONS: Outpatient Prescriptions Prior to Visit  Medication Sig Dispense Refill  . aspirin 81 MG tablet Take 81 mg by mouth daily.    . baclofen (LIORESAL) 10 MG tablet Take 0.5 tablets (5 mg total) by mouth 2 (two) times daily. 30 each 1  . clonazePAM (KLONOPIN) 0.5 MG tablet Take 0.5 mg by mouth  at bedtime as needed for anxiety.    . metoprolol tartrate (LOPRESSOR) 25 MG tablet     . mirabegron ER (MYRBETRIQ) 50 MG TB24 tablet Take 50 mg by mouth daily.    [name] omeprazole (PRILOSEC) 20 MG capsule Take 1 capsule (20 mg total) by mouth daily.    . predniSONE (DELTASONE) 5 MG tablet Take 1.5 tablets (7.5 mg total) by mouth daily with breakfast. 50 tablet 5  . traMADol (ULTRAM) 50 MG tablet Take  50 mg by mouth every 6 (six) hours as needed for pain.    . valsartan-hydrochlorothiazide (DIOVAN-HCT) 160-12.5 MG per tablet 1 tablet every morning.    . venlafaxine XR (EFFEXOR-XR) 150 MG 24 hr capsule Take 150 mg by mouth at bedtime.     No facility-administered medications prior to visit.    PAST MEDICAL HISTORY: Past Medical History  Diagnosis Date  . Multiple sclerosis   . Spastic paraparesis   . Depression   . Anxiety   . Fibromyalgia   . Rheumatoid arteritis   . Neurogenic bladder   . Abnormality of gait   . Lumbago   . Low back pain 10/31/2014    PAST SURGICAL HISTORY: Past Surgical History  Procedure Laterality Date  . Abdominal hysterectomy    . Appendectomy    . Gallbladder surgery N/A   . Fracture surgery      FAMILY HISTORY: Family History  Problem Relation Age of Onset  . Lung cancer Mother   . Heart disease Father   . Other Father   . Multiple sclerosis Brother     SOCIAL HISTORY: Social History   Social History  . Marital Status: Married    Spouse Name: N/A  . Number of Children: 2  . Years of Education: college jr   Occupational History  . Not on file.   Social History Main Topics  . Smoking status: Never Smoker   . Smokeless tobacco: Never Used  . Alcohol Use: No     Comment: Drink wine on occasion  . Drug Use: No  . Sexual Activity: Not on file   Other Topics Concern  . Not on file   Social History Narrative   Patient is married Chrissie Noa) and lives at home with her husband.   Patient has one living son and one son is deceased.   Patient is retired.   Patient has college education.   Patient is right-handed.   Patient drinks three cups of coffee in the morning.         PHYSICAL EXAM  Filed Vitals:   01/09/15 1448  Height: 5\' 6"  (1.676 m)   There is no weight on file to calculate BMI.  Generalized: Well developed, in no acute distress   Neurological examination  Mentation: Alert oriented to time, place, history  taking. Follows all commands speech and language fluent Cranial nerve II-XII: Pupils were equal round reactive to light. Extraocular movements were full, visual field were full on confrontational test. Facial sensation and strength were normal. Uvula tongue midline. Head turning and shoulder shrug  were normal and symmetric. Motor: The motor testing reveals 5 over 5 strength in the upper extremites. Proximal weakness in the lower extremities.  Sensory: Sensory testing is intact to soft touch on all 4 extremities. No evidence of extinction is noted.  Coordination: Cerebellar testing reveals good finger-nose-finger bilaterally but difficulty with heel-to-shin bilaterally due to weakness Gait and station: Patient has stiffness in both legs. Dragging both legs that she walks. Has  a mild circumduction gait with both legs. Tandem gait not attempted. She uses a rolling walker when imitating. Reflexes: Deep tendon reflexes are symmetric but depressed   DIAGNOSTIC DATA (LABS, IMAGING, TESTING) - I reviewed patient records, labs, notes, testing and imaging myself where available.  ASSESSMENT AND PLAN 74 y.o. year old female  has a past medical history of Multiple sclerosis; Spastic paraparesis; Depression; Anxiety; Fibromyalgia; Rheumatoid arteritis; Neurogenic bladder; Abnormality of gait; Lumbago; and Low back pain (10/31/2014). here with:  1. Multiple sclerosis 2. Abnormality of gait 3. Muscle spasticity  Overall the patient is doing well. I have encouraged the patient to use the baclofen 5 mg twice a day. I feel that this will help with her ambulation. I feel that the patient would benefit from rollator as she as stiffness in both legs affecting her gait. She is also unable to ambulate for long distances without resting.[name] She will continue following up with her urologist regarding urinary frequency. Patient advised that if her symptoms worsen or she develops any new symptoms she should  let us know. She  will follow-up in 6 months or sooner if needed.  Addendum: Patient is encouraged to use walker as often as possible onset of her power wheelchair as the patient is mobile and it is better for her health and stability to continue ambulating while possible due to MS. Rollator was prescribed so that when patient is fatigue she is able to rest before continuing   Ward Givens, MSN, NP-C 01/09/2015, 2:55 PM [facility] [address], [place], [place] [address] [phone]

## 2015-01-09 NOTE — Patient Instructions (Signed)
Take baclofen 5 mg twice a day. Continue Prednisone.  Continue to use your walker If your symptoms worsen or you develop new symptoms please let us know.

## 2015-02-06 ENCOUNTER — Telehealth: Payer: Self-pay | Admitting: Adult Health

## 2015-02-06 NOTE — Telephone Encounter (Signed)
Primitivo Gauze [phone] with [facility] called needing new RX for rollator with NPI written on RX. She said the RX she has does not have NPI on it and it has be  written on RX not on separate piece of paper. Fax [phone]

## 2015-02-08 DIAGNOSIS — M461 Sacroiliitis, not elsewhere classified: Secondary | ICD-10-CM | POA: Diagnosis not present

## 2015-02-13 ENCOUNTER — Telehealth: Payer: Self-pay | Admitting: Adult Health

## 2015-02-13 MED ORDER — PREDNISONE 5 MG PO TABS: 5.0000 mg | ORAL_TABLET | Freq: Every day | ORAL | Status: DC

## 2015-02-13 NOTE — Telephone Encounter (Signed)
Faxed Rx w/ NPI to fax number provided in previous message.

## 2015-02-13 NOTE — Telephone Encounter (Signed)
It does not appear there is an [facility] in the system.  I called back to verify pharmacy info.  Patient said she just needs Rx sent to [facility] for 90 day supply, says it is not in [place].  Rx has been sent.  Receipt confirmed by pharmacy.  Patient is aware.

## 2015-02-13 NOTE — Telephone Encounter (Signed)
Pt needs refill on predniSONE (DELTASONE) 5 MG tablet. Please send to [facility]. May call pt [phone]

## 2015-02-21 ENCOUNTER — Other Ambulatory Visit: Payer: Self-pay

## 2015-02-21 DIAGNOSIS — G35 Multiple sclerosis: Secondary | ICD-10-CM

## 2015-02-21 NOTE — Progress Notes (Signed)
In order to get pt a rollator, [facility] is requesting a new prescription dated AFTER 01/09/2015. It must be dated for today's date. Will fax Megan's office notes and an RX to [facility].

## 2015-03-02 DIAGNOSIS — I10 Essential (primary) hypertension: Secondary | ICD-10-CM | POA: Diagnosis not present

## 2015-03-02 DIAGNOSIS — R5383 Other fatigue: Secondary | ICD-10-CM | POA: Diagnosis not present

## 2015-03-02 DIAGNOSIS — E782 Mixed hyperlipidemia: Secondary | ICD-10-CM | POA: Diagnosis not present

## 2015-03-02 DIAGNOSIS — E559 Vitamin D deficiency, unspecified: Secondary | ICD-10-CM | POA: Diagnosis not present

## 2015-03-09 DIAGNOSIS — I10 Essential (primary) hypertension: Secondary | ICD-10-CM | POA: Diagnosis not present

## 2015-03-09 DIAGNOSIS — G35 Multiple sclerosis: Secondary | ICD-10-CM | POA: Diagnosis not present

## 2015-03-09 DIAGNOSIS — M810 Age-related osteoporosis without current pathological fracture: Secondary | ICD-10-CM | POA: Diagnosis not present

## 2015-03-09 DIAGNOSIS — M545 Low back pain: Secondary | ICD-10-CM | POA: Diagnosis not present

## 2015-03-09 DIAGNOSIS — M05419 Rheumatoid myopathy with rheumatoid arthritis of unspecified shoulder: Secondary | ICD-10-CM | POA: Diagnosis not present

## 2015-03-09 DIAGNOSIS — M1711 Unilateral primary osteoarthritis, right knee: Secondary | ICD-10-CM | POA: Diagnosis not present

## 2015-03-09 DIAGNOSIS — M797 Fibromyalgia: Secondary | ICD-10-CM | POA: Diagnosis not present

## 2015-03-09 DIAGNOSIS — E782 Mixed hyperlipidemia: Secondary | ICD-10-CM | POA: Diagnosis not present

## 2015-03-13 DIAGNOSIS — R351 Nocturia: Secondary | ICD-10-CM | POA: Diagnosis not present

## 2015-03-13 DIAGNOSIS — N3941 Urge incontinence: Secondary | ICD-10-CM | POA: Diagnosis not present

## 2015-03-20 ENCOUNTER — Telehealth: Payer: Self-pay | Admitting: Adult Health

## 2015-03-20 NOTE — Telephone Encounter (Signed)
Patient is calling and states that since Sunday she has had extreme swelling in her feet.  They are very tight and a purple color, tight feeling, burning and make it hard for her to walk.  She is taking her BP medication and her PCP states she is otherwise in pretty good health but has suggested to her this might be a result of her MS. The patient will be leaving to go with her husband to a dr's appointment about 12:30 or she can be reached tomorrow.  Please call and advise.

## 2015-03-20 NOTE — Telephone Encounter (Signed)
Attempted to call, no answer.

## 2015-03-21 NOTE — Telephone Encounter (Signed)
LMVM for pt to return call.   

## 2015-03-22 NOTE — Telephone Encounter (Signed)
No answer

## 2015-03-28 DIAGNOSIS — G35 Multiple sclerosis: Secondary | ICD-10-CM | POA: Diagnosis not present

## 2015-03-28 DIAGNOSIS — M461 Sacroiliitis, not elsewhere classified: Secondary | ICD-10-CM | POA: Diagnosis not present

## 2015-03-28 DIAGNOSIS — M545 Low back pain: Secondary | ICD-10-CM | POA: Diagnosis not present

## 2015-04-03 DIAGNOSIS — G35 Multiple sclerosis: Secondary | ICD-10-CM | POA: Diagnosis not present

## 2015-04-03 DIAGNOSIS — M545 Low back pain: Secondary | ICD-10-CM | POA: Diagnosis not present

## 2015-04-03 DIAGNOSIS — M461 Sacroiliitis, not elsewhere classified: Secondary | ICD-10-CM | POA: Diagnosis not present

## 2015-04-05 DIAGNOSIS — G35 Multiple sclerosis: Secondary | ICD-10-CM | POA: Diagnosis not present

## 2015-04-05 DIAGNOSIS — M461 Sacroiliitis, not elsewhere classified: Secondary | ICD-10-CM | POA: Diagnosis not present

## 2015-04-05 DIAGNOSIS — M545 Low back pain: Secondary | ICD-10-CM | POA: Diagnosis not present

## 2015-04-11 DIAGNOSIS — M545 Low back pain: Secondary | ICD-10-CM | POA: Diagnosis not present

## 2015-04-11 DIAGNOSIS — G35 Multiple sclerosis: Secondary | ICD-10-CM | POA: Diagnosis not present

## 2015-04-11 DIAGNOSIS — M461 Sacroiliitis, not elsewhere classified: Secondary | ICD-10-CM | POA: Diagnosis not present

## 2015-04-12 DIAGNOSIS — M545 Low back pain: Secondary | ICD-10-CM | POA: Diagnosis not present

## 2015-04-12 DIAGNOSIS — G35 Multiple sclerosis: Secondary | ICD-10-CM | POA: Diagnosis not present

## 2015-04-12 DIAGNOSIS — M461 Sacroiliitis, not elsewhere classified: Secondary | ICD-10-CM | POA: Diagnosis not present

## 2015-04-16 DIAGNOSIS — G35 Multiple sclerosis: Secondary | ICD-10-CM | POA: Diagnosis not present

## 2015-04-16 DIAGNOSIS — M461 Sacroiliitis, not elsewhere classified: Secondary | ICD-10-CM | POA: Diagnosis not present

## 2015-04-16 DIAGNOSIS — M545 Low back pain: Secondary | ICD-10-CM | POA: Diagnosis not present

## 2015-04-18 DIAGNOSIS — M545 Low back pain: Secondary | ICD-10-CM | POA: Diagnosis not present

## 2015-04-18 DIAGNOSIS — G35 Multiple sclerosis: Secondary | ICD-10-CM | POA: Diagnosis not present

## 2015-04-18 DIAGNOSIS — M461 Sacroiliitis, not elsewhere classified: Secondary | ICD-10-CM | POA: Diagnosis not present

## 2015-04-23 DIAGNOSIS — M461 Sacroiliitis, not elsewhere classified: Secondary | ICD-10-CM | POA: Diagnosis not present

## 2015-04-23 DIAGNOSIS — M545 Low back pain: Secondary | ICD-10-CM | POA: Diagnosis not present

## 2015-04-23 DIAGNOSIS — G35 Multiple sclerosis: Secondary | ICD-10-CM | POA: Diagnosis not present

## 2015-04-25 DIAGNOSIS — M461 Sacroiliitis, not elsewhere classified: Secondary | ICD-10-CM | POA: Diagnosis not present

## 2015-04-25 DIAGNOSIS — G35 Multiple sclerosis: Secondary | ICD-10-CM | POA: Diagnosis not present

## 2015-04-25 DIAGNOSIS — M545 Low back pain: Secondary | ICD-10-CM | POA: Diagnosis not present

## 2015-04-26 DIAGNOSIS — Z719 Counseling, unspecified: Secondary | ICD-10-CM | POA: Diagnosis not present

## 2015-04-26 DIAGNOSIS — M79644 Pain in right finger(s): Secondary | ICD-10-CM | POA: Diagnosis not present

## 2015-04-26 DIAGNOSIS — M1811 Unilateral primary osteoarthritis of first carpometacarpal joint, right hand: Secondary | ICD-10-CM | POA: Diagnosis not present

## 2015-04-26 DIAGNOSIS — M542 Cervicalgia: Secondary | ICD-10-CM | POA: Diagnosis not present

## 2015-04-26 DIAGNOSIS — M25572 Pain in left ankle and joints of left foot: Secondary | ICD-10-CM | POA: Diagnosis not present

## 2015-04-26 DIAGNOSIS — M7672 Peroneal tendinitis, left leg: Secondary | ICD-10-CM | POA: Diagnosis not present

## 2015-04-26 DIAGNOSIS — M5030 Other cervical disc degeneration, unspecified cervical region: Secondary | ICD-10-CM | POA: Diagnosis not present

## 2015-04-26 DIAGNOSIS — G35 Multiple sclerosis: Secondary | ICD-10-CM | POA: Diagnosis not present

## 2015-04-30 DIAGNOSIS — M461 Sacroiliitis, not elsewhere classified: Secondary | ICD-10-CM | POA: Diagnosis not present

## 2015-04-30 DIAGNOSIS — G35 Multiple sclerosis: Secondary | ICD-10-CM | POA: Diagnosis not present

## 2015-04-30 DIAGNOSIS — M545 Low back pain: Secondary | ICD-10-CM | POA: Diagnosis not present

## 2015-05-02 DIAGNOSIS — M545 Low back pain: Secondary | ICD-10-CM | POA: Diagnosis not present

## 2015-05-02 DIAGNOSIS — G35 Multiple sclerosis: Secondary | ICD-10-CM | POA: Diagnosis not present

## 2015-05-02 DIAGNOSIS — M461 Sacroiliitis, not elsewhere classified: Secondary | ICD-10-CM | POA: Diagnosis not present

## 2015-05-07 DIAGNOSIS — M461 Sacroiliitis, not elsewhere classified: Secondary | ICD-10-CM | POA: Diagnosis not present

## 2015-05-07 DIAGNOSIS — G35 Multiple sclerosis: Secondary | ICD-10-CM | POA: Diagnosis not present

## 2015-05-07 DIAGNOSIS — M545 Low back pain: Secondary | ICD-10-CM | POA: Diagnosis not present

## 2015-05-08 ENCOUNTER — Telehealth: Payer: Self-pay | Admitting: Neurology

## 2015-05-08 DIAGNOSIS — G35 Multiple sclerosis: Secondary | ICD-10-CM

## 2015-05-08 DIAGNOSIS — M21371 Foot drop, right foot: Secondary | ICD-10-CM

## 2015-05-08 DIAGNOSIS — M21372 Foot drop, left foot: Secondary | ICD-10-CM

## 2015-05-08 NOTE — Telephone Encounter (Signed)
I called the patient. The patient has bilateral foot drops, the left leg is weaker, she also has weakness with hip flexion bilaterally. We have discussed previously about getting bilateral AFO braces do help her walk. She wishes to consider this, I will make a referral for evaluation for AFO braces.

## 2015-05-08 NOTE — Telephone Encounter (Signed)
Patient called to request that Dr. Jannifer Franklin send order in for foot lift to [facility] [address] [place], [phone] ask for [place].

## 2015-05-09 DIAGNOSIS — G35 Multiple sclerosis: Secondary | ICD-10-CM | POA: Diagnosis not present

## 2015-05-09 DIAGNOSIS — M461 Sacroiliitis, not elsewhere classified: Secondary | ICD-10-CM | POA: Diagnosis not present

## 2015-05-09 DIAGNOSIS — M545 Low back pain: Secondary | ICD-10-CM | POA: Diagnosis not present

## 2015-05-10 DIAGNOSIS — G35 Multiple sclerosis: Secondary | ICD-10-CM | POA: Diagnosis not present

## 2015-05-10 DIAGNOSIS — M67431 Ganglion, right wrist: Secondary | ICD-10-CM | POA: Diagnosis not present

## 2015-05-10 DIAGNOSIS — M25572 Pain in left ankle and joints of left foot: Secondary | ICD-10-CM | POA: Diagnosis not present

## 2015-05-10 DIAGNOSIS — Z719 Counseling, unspecified: Secondary | ICD-10-CM | POA: Diagnosis not present

## 2015-05-10 DIAGNOSIS — M79644 Pain in right finger(s): Secondary | ICD-10-CM | POA: Diagnosis not present

## 2015-05-10 DIAGNOSIS — M1811 Unilateral primary osteoarthritis of first carpometacarpal joint, right hand: Secondary | ICD-10-CM | POA: Diagnosis not present

## 2015-05-10 DIAGNOSIS — M542 Cervicalgia: Secondary | ICD-10-CM | POA: Diagnosis not present

## 2015-05-15 DIAGNOSIS — M461 Sacroiliitis, not elsewhere classified: Secondary | ICD-10-CM | POA: Diagnosis not present

## 2015-05-15 DIAGNOSIS — M545 Low back pain: Secondary | ICD-10-CM | POA: Diagnosis not present

## 2015-05-15 DIAGNOSIS — G35 Multiple sclerosis: Secondary | ICD-10-CM | POA: Diagnosis not present

## 2015-05-16 DIAGNOSIS — G35 Multiple sclerosis: Secondary | ICD-10-CM | POA: Diagnosis not present

## 2015-05-16 DIAGNOSIS — M461 Sacroiliitis, not elsewhere classified: Secondary | ICD-10-CM | POA: Diagnosis not present

## 2015-05-16 DIAGNOSIS — M545 Low back pain: Secondary | ICD-10-CM | POA: Diagnosis not present

## 2015-05-21 DIAGNOSIS — M25552 Pain in left hip: Secondary | ICD-10-CM | POA: Diagnosis not present

## 2015-05-21 DIAGNOSIS — Z719 Counseling, unspecified: Secondary | ICD-10-CM | POA: Diagnosis not present

## 2015-05-22 DIAGNOSIS — M545 Low back pain: Secondary | ICD-10-CM | POA: Diagnosis not present

## 2015-05-22 DIAGNOSIS — G35 Multiple sclerosis: Secondary | ICD-10-CM | POA: Diagnosis not present

## 2015-05-22 DIAGNOSIS — M461 Sacroiliitis, not elsewhere classified: Secondary | ICD-10-CM | POA: Diagnosis not present

## 2015-05-23 DIAGNOSIS — G35 Multiple sclerosis: Secondary | ICD-10-CM | POA: Diagnosis not present

## 2015-05-23 DIAGNOSIS — M545 Low back pain: Secondary | ICD-10-CM | POA: Diagnosis not present

## 2015-05-23 DIAGNOSIS — M461 Sacroiliitis, not elsewhere classified: Secondary | ICD-10-CM | POA: Diagnosis not present

## 2015-05-24 DIAGNOSIS — Z961 Presence of intraocular lens: Secondary | ICD-10-CM | POA: Diagnosis not present

## 2015-05-24 DIAGNOSIS — H04123 Dry eye syndrome of bilateral lacrimal glands: Secondary | ICD-10-CM | POA: Diagnosis not present

## 2015-05-30 DIAGNOSIS — M545 Low back pain: Secondary | ICD-10-CM | POA: Diagnosis not present

## 2015-05-30 DIAGNOSIS — G35 Multiple sclerosis: Secondary | ICD-10-CM | POA: Diagnosis not present

## 2015-05-30 DIAGNOSIS — M461 Sacroiliitis, not elsewhere classified: Secondary | ICD-10-CM | POA: Diagnosis not present

## 2015-06-06 DIAGNOSIS — G35 Multiple sclerosis: Secondary | ICD-10-CM | POA: Diagnosis not present

## 2015-06-06 DIAGNOSIS — M461 Sacroiliitis, not elsewhere classified: Secondary | ICD-10-CM | POA: Diagnosis not present

## 2015-06-06 DIAGNOSIS — M545 Low back pain: Secondary | ICD-10-CM | POA: Diagnosis not present

## 2015-06-25 DIAGNOSIS — H04123 Dry eye syndrome of bilateral lacrimal glands: Secondary | ICD-10-CM | POA: Diagnosis not present

## 2015-07-10 ENCOUNTER — Ambulatory Visit (INDEPENDENT_AMBULATORY_CARE_PROVIDER_SITE_OTHER): Payer: Medicare Other | Admitting: Adult Health

## 2015-07-10 ENCOUNTER — Encounter: Payer: Self-pay | Admitting: Adult Health

## 2015-07-10 VITALS — BP 132/66 | HR 72 | Resp 16 | Ht 66.0 in | Wt 169.0 lb

## 2015-07-10 DIAGNOSIS — G35 Multiple sclerosis: Secondary | ICD-10-CM | POA: Diagnosis not present

## 2015-07-10 DIAGNOSIS — IMO0002 Reserved for concepts with insufficient information to code with codable children: Secondary | ICD-10-CM

## 2015-07-10 DIAGNOSIS — G8389 Other specified paralytic syndromes: Secondary | ICD-10-CM | POA: Diagnosis not present

## 2015-07-10 DIAGNOSIS — R269 Unspecified abnormalities of gait and mobility: Secondary | ICD-10-CM | POA: Diagnosis not present

## 2015-07-10 NOTE — Patient Instructions (Signed)
Try AFO braces with PT If your symptoms worsen or you develop new symptoms please let us know.

## 2015-07-10 NOTE — Progress Notes (Signed)
PATIENT: Stephanie Perez DOB: 06-25-1940  REASON FOR VISIT: follow up- multiple sclerosis HISTORY FROM: patient  HISTORY OF PRESENT ILLNESS: Stephanie Perez is a 75 year old female with a history of chronic progressive course of multiple sclerosis. She returns today for follow-up. She is not on any disease modifying agents at this time. The patient states that her main concern is that she continues to have falls. In the past is recommended that she use AFO braces however she states that she's never received these. She has been to physical therapy in the past with some benefit. She does use a rolling walker when ambulating. She states that she tends to drag both feet when she is trying to walk. She reports that she does have some swelling in the feet and ankles-- the left side is greater than the right. She states in the past she's been to an orthopedist who felt that she had pulled a ligament in the left foot. She states that at times the left foot will be dark red- almost black? She states that she does have burning in the feet however this has been ongoing. She states that she had a physician put her on Klonopin>10 years ago for jerking in the left leg as well as burning in the feet. She reports the Klonopin is working well for her. She returns today for an evaluation.  HISTORY 01/09/15:Stephanie Perez is a 75 year old female with a history of chronic progressive course of multiple sclerosis. She returns today for follow-up visit. She is not on any disease modifying agents at this time. at the last visit her prednisone was increased to 7.5 mg but she was confused and continue taking 5 mg daily. Patient reports that that she feels that she is doing well. She states that she continues to use a walker for short distances and will use a motorized chair for longer distances. The patient was also given baclofen for muscle spasticity. She states that she's been using it only on an as-needed basis. She reports that  she was having excessive sweating especially at night. Her primary care started her on Estradial. Patient states that she continues to have some issues with urinating. She is on myrbetriq but felt that that wasn't beneficial. She did see a new urologist who felt that her urinating frequency could be due to constipation. He requested that the patient start using flaxseed as well as fiber. The patient states that she has been doing this but still doesn't have the urge to defecate most times. Overall the patient feels that she is doing better. She denies any new neurological symptoms. She returns today for an evaluation.  HISTORY 10/31/14 (WILLIS): Stephanie. Perez is a 79 year old right-handed white female with a history of a chronic progressive course of multiple sclerosis. The patient is gradually worsening with her ability to ambulate with increased spasticity in both legs. The patient was seen back in May 2016, she indicated that she had done much better with her walking associated with physical therapy. Since that time, the patient has had a significant increase in fatigue and worsening of walking, multiple falls throughout the summer. The patient has had at least 10 falls, she may collapse in the legs, fall backwards, or go to the side. The patient has a walker for ambulation, but even with this she may fall. The leg stiffness has gotten much worse, she is doing a bit better with her urinary incontinence on Myrbetriq. The patient reports a general increase in fatigue. She  does take low-dose of prednisone 5 mg daily. She has reported onset of low back pain and some pain into the groin area and down to the thighs. This is worse with weightbearing. She is on diazepam for spasticity, previously she did not tolerate the tizanidine. She is only taking to the 2 mg diazepam tablets during the day. She has also noted some increased weakness in the hands and fingers. She returns for an evaluation   REVIEW OF SYSTEMS: Out  of a complete 14 system review of symptoms, the patient complains only of the following symptoms, and all other reviewed systems are negative.  See history of present illness  ALLERGIES: Allergies  Allergen Reactions  . Bactrim [Sulfamethoxazole-Trimethoprim]   . Codeine Sulfate   . Lyrica [Pregabalin]   . Zanaflex [Tizanidine Hcl]   . Sulfa Antibiotics     HOME MEDICATIONS: Outpatient Prescriptions Prior to Visit  Medication Sig Dispense Refill  . aspirin 81 MG tablet Take 81 mg by mouth daily.    . baclofen (LIORESAL) 10 MG tablet Take 0.5 tablets (5 mg total) by mouth 2 (two) times daily. 30 each 1  . clonazePAM (KLONOPIN) 0.5 MG tablet Take 0.5 mg by mouth at bedtime.     [name] estradiol (ESTRACE) 0.5 MG tablet Take 1 tablet by mouth daily.  12  . metoprolol tartrate (LOPRESSOR) 25 MG tablet     . mirabegron ER (MYRBETRIQ) 50 MG TB24 tablet Take 50 mg by mouth daily.    [name] omeprazole (PRILOSEC) 20 MG capsule Take 1 capsule (20 mg total) by mouth daily.    . predniSONE (DELTASONE) 5 MG tablet Take 1 tablet (5 mg total) by mouth daily with breakfast. 90 tablet 1  . traMADol (ULTRAM) 50 MG tablet Take 50 mg by mouth every 6 (six) hours as needed for pain.    . valsartan-hydrochlorothiazide (DIOVAN-HCT) 320-25 MG tablet Take 1 tablet by mouth daily.  1  . venlafaxine XR (EFFEXOR-XR) 150 MG 24 hr capsule Take 150 mg by mouth at bedtime.     No facility-administered medications prior to visit.    PAST MEDICAL HISTORY: Past Medical History  Diagnosis Date  . Multiple sclerosis ([place])   . Spastic paraparesis ([place])   . Depression   . Anxiety   . Fibromyalgia   . Rheumatoid arteritis   . Neurogenic bladder   . Abnormality of gait   . Lumbago   . Low back pain 10/31/2014    PAST SURGICAL HISTORY: Past Surgical History  Procedure Laterality Date  . Abdominal hysterectomy    . Appendectomy    . Gallbladder surgery N/A   . Fracture surgery      FAMILY HISTORY: Family History    Problem Relation Age of Onset  . Lung cancer Mother   . Heart disease Father   . Other Father   . Multiple sclerosis Brother     SOCIAL HISTORY: Social History   Social History  . Marital Status: Married    Spouse Name: N/A  . Number of Children: 2  . Years of Education: college jr   Occupational History  . Not on file.   Social History Main Topics  . Smoking status: Never Smoker   . Smokeless tobacco: Never Used  . Alcohol Use: No     Comment: Drink wine on occasion  . Drug Use: No  . Sexual Activity: Not on file   Other Topics Concern  . Not on file   Social History Narrative  Patient is married Chrissie Noa) and lives at home with her husband.   Patient has one living son and one son is deceased.   Patient is retired.   Patient has college education.   Patient is right-handed.   Patient drinks three cups of coffee in the morning.         PHYSICAL EXAM  Filed Vitals:   07/10/15 1511  BP: 132/66  Pulse: 72  Resp: 16  Height: 5\' 6"  (1.676 m)  Weight: 169 lb (76.658 kg)   Body mass index is 27.29 kg/(m^2).  Generalized: Well developed, in no acute distress   Neurological examination  Mentation: Alert oriented to time, place, history taking. Follows all commands speech and language fluent Cranial nerve II-XII: Pupils were equal round reactive to light. Extraocular movements were full, visual field were full on confrontational test. Facial sensation and strength were normal. Uvula tongue midline. Head turning and shoulder shrug  were normal and symmetric. Motor: The motor testing reveals 5 over 5 strength in the upper extremity. 2 out of 5 strength in the lower extremities. Increased tone noted in the lower extremities Sensory: Sensory testing is intact to soft touch on all 4 extremities. No evidence of extinction is noted.  Coordination: Cerebellar testing reveals good finger-nose-finger bilaterally and difficulty with heel-to-shin bilaterally.  Gait and  station: Patient uses a Rollator when ambulating. She has a circumduction type gait on both legs. She tends to drag the toes when in ambulating. Tandem gait not attempted.  Reflexes: Deep tendon reflexes are symmetric but depressed  DIAGNOSTIC DATA (LABS, IMAGING, TESTING) - I reviewed patient records, labs, notes, testing and imaging myself where available.       ASSESSMENT AND PLAN 75 y.o. year old female  has a past medical history of Multiple sclerosis ([place]); Spastic paraparesis ([place]); Depression; Anxiety; Fibromyalgia; Rheumatoid arteritis; Neurogenic bladder; Abnormality of gait; Lumbago; and Low back pain (10/31/2014). here with:  1. Multiple sclerosis 2. Spastic paraparesis 3. Abnormality of gait  The patient continues to have frequent falls. She she was never given AFO braces. I will give the patient a prescription for bilateral AFO braces. I will also write for physical therapy to assist her with using the AFO braces. Patient is amenable to this plan. I advised that if her symptoms worsen or she develops any new symptoms she should let us know. She will follow-up in 6 months or sooner if needed.   Ward Givens, MSN, NP-C 07/10/2015, 4:18 PM [facility] [address] [place], [place] [address] [phone]

## 2015-07-10 NOTE — Progress Notes (Signed)
I have read the note, and I agree with the clinical assessment and plan.  Stephanie Perez,Stephanie Perez   

## 2015-07-13 DIAGNOSIS — I10 Essential (primary) hypertension: Secondary | ICD-10-CM | POA: Diagnosis not present

## 2015-07-13 DIAGNOSIS — M545 Low back pain: Secondary | ICD-10-CM | POA: Diagnosis not present

## 2015-07-13 DIAGNOSIS — G35 Multiple sclerosis: Secondary | ICD-10-CM | POA: Diagnosis not present

## 2015-07-13 DIAGNOSIS — M810 Age-related osteoporosis without current pathological fracture: Secondary | ICD-10-CM | POA: Diagnosis not present

## 2015-07-13 DIAGNOSIS — M1711 Unilateral primary osteoarthritis, right knee: Secondary | ICD-10-CM | POA: Diagnosis not present

## 2015-07-13 DIAGNOSIS — E782 Mixed hyperlipidemia: Secondary | ICD-10-CM | POA: Diagnosis not present

## 2015-07-13 DIAGNOSIS — M05419 Rheumatoid myopathy with rheumatoid arthritis of unspecified shoulder: Secondary | ICD-10-CM | POA: Diagnosis not present

## 2015-07-13 DIAGNOSIS — M797 Fibromyalgia: Secondary | ICD-10-CM | POA: Diagnosis not present

## 2015-07-20 DIAGNOSIS — I739 Peripheral vascular disease, unspecified: Secondary | ICD-10-CM | POA: Diagnosis not present

## 2015-07-20 DIAGNOSIS — I70213 Atherosclerosis of native arteries of extremities with intermittent claudication, bilateral legs: Secondary | ICD-10-CM | POA: Diagnosis not present

## 2015-07-30 ENCOUNTER — Telehealth: Payer: Self-pay | Admitting: Neurology

## 2015-07-30 NOTE — Telephone Encounter (Signed)
I called the patient. The vascular arterial Doppler study showed a moderate level of peripheral vascular disease on the left. This study was normal on the right. The problems reported by the patient include a purplish discoloration the foot when the foot is dependent, the coloration improves when the foot is elevated, his appears to be a venous issue not arterial, not clear that a vascular surgery consultation is in order. The Doppler study was ordered by the primary care physician, not through this office.

## 2015-07-30 NOTE — Telephone Encounter (Signed)
Patient called to request to speak to someone regarding results of vascular testing (Arterial Doppler). Please call [phone].

## 2015-07-30 NOTE — Telephone Encounter (Signed)
Returned pt TC. Reviewed doppler results w/ pt. She voiced concern about burning pain, swelling and discoloration of legs. Questions answered re: peripheral artery disease. Education offered and healthy diet and exercise encouraged. Pt agreed to swim in her pool and walk for exercise. Says that she does have an appt for AFO brace fitting tomorrow. She would like to consider referral to vascular for further treatment/recommendations.

## 2015-07-30 NOTE — Telephone Encounter (Signed)
Arterial Doppler of the lower extremities shows no abnormalities on the right, evidence of moderate peripheral arterial disease on the left.

## 2015-08-09 ENCOUNTER — Telehealth: Payer: Self-pay | Admitting: Neurology

## 2015-08-09 NOTE — Telephone Encounter (Signed)
Stephanie Perez/[facility] [phone] said she requested last OV notes (07/10/15) with RX but did not rec it. I faxed it to her

## 2015-08-17 ENCOUNTER — Telehealth: Payer: Self-pay | Admitting: Neurology

## 2015-08-17 MED ORDER — BACLOFEN 10 MG PO TABS: 5.0000 mg | ORAL_TABLET | Freq: Two times a day (BID) | ORAL | Status: AC

## 2015-08-17 NOTE — Telephone Encounter (Signed)
Retailed to pharmacy as requested.

## 2015-08-17 NOTE — Telephone Encounter (Signed)
Patient requesting refill of baclofen (LIORESAL) 10 MG tablet called to [facility] in [place].

## 2015-08-22 DIAGNOSIS — M545 Low back pain: Secondary | ICD-10-CM | POA: Diagnosis not present

## 2015-08-22 DIAGNOSIS — Z6827 Body mass index (BMI) 27.0-27.9, adult: Secondary | ICD-10-CM | POA: Diagnosis not present

## 2015-08-22 DIAGNOSIS — S76011A Strain of muscle, fascia and tendon of right hip, initial encounter: Secondary | ICD-10-CM | POA: Diagnosis not present

## 2015-08-27 DIAGNOSIS — L821 Other seborrheic keratosis: Secondary | ICD-10-CM | POA: Diagnosis not present

## 2015-08-27 DIAGNOSIS — I8392 Asymptomatic varicose veins of left lower extremity: Secondary | ICD-10-CM | POA: Diagnosis not present

## 2015-08-27 DIAGNOSIS — T887XXA Unspecified adverse effect of drug or medicament, initial encounter: Secondary | ICD-10-CM | POA: Diagnosis not present

## 2015-08-27 DIAGNOSIS — L820 Inflamed seborrheic keratosis: Secondary | ICD-10-CM | POA: Diagnosis not present

## 2015-08-27 DIAGNOSIS — I872 Venous insufficiency (chronic) (peripheral): Secondary | ICD-10-CM | POA: Diagnosis not present

## 2015-08-27 DIAGNOSIS — L814 Other melanin hyperpigmentation: Secondary | ICD-10-CM | POA: Diagnosis not present

## 2015-09-19 DIAGNOSIS — M545 Low back pain: Secondary | ICD-10-CM | POA: Diagnosis not present

## 2015-09-19 DIAGNOSIS — R609 Edema, unspecified: Secondary | ICD-10-CM | POA: Diagnosis not present

## 2015-09-19 DIAGNOSIS — M461 Sacroiliitis, not elsewhere classified: Secondary | ICD-10-CM | POA: Diagnosis not present

## 2015-09-19 DIAGNOSIS — M79605 Pain in left leg: Secondary | ICD-10-CM | POA: Diagnosis not present

## 2015-10-01 ENCOUNTER — Telehealth: Payer: Self-pay | Admitting: Adult Health

## 2015-10-01 NOTE — Telephone Encounter (Signed)
Patient returned Sandy's call °

## 2015-10-01 NOTE — Telephone Encounter (Signed)
Spoke to pt and she had been using her AFO braces bilaterally and after 6 days her L foot was swelling and having skin color changes.  She went to pain clinic down where she lives and he stated to stop using braces at this time and she had doppler studies for DVT.  She still has swelling of and on, and bruising.  Has appt with vascular surgeon next Monday at 0930 and then Wednesday with foot doctor.   I told her to get checked by these doctors, have them release information to [place].  Will see about PT later on as related to afo braces.  (she had not seen PT when she got braces).  [facility] (as previous referral noted).  .  She will let us know.

## 2015-10-01 NOTE — Telephone Encounter (Signed)
Patient called requesting to speak to NP, Megan regarding braces for both feet, states Jinny Blossom knows all about it. Please call [phone].

## 2015-10-01 NOTE — Telephone Encounter (Signed)
Noted  

## 2015-10-01 NOTE — Telephone Encounter (Signed)
I called and no answer.

## 2015-10-05 DIAGNOSIS — I8392 Asymptomatic varicose veins of left lower extremity: Secondary | ICD-10-CM | POA: Diagnosis not present

## 2015-10-05 DIAGNOSIS — M79604 Pain in right leg: Secondary | ICD-10-CM | POA: Diagnosis not present

## 2015-10-05 DIAGNOSIS — I10 Essential (primary) hypertension: Secondary | ICD-10-CM | POA: Diagnosis not present

## 2015-10-05 DIAGNOSIS — R600 Localized edema: Secondary | ICD-10-CM | POA: Diagnosis not present

## 2015-10-05 DIAGNOSIS — I493 Ventricular premature depolarization: Secondary | ICD-10-CM | POA: Diagnosis not present

## 2015-10-05 DIAGNOSIS — M79605 Pain in left leg: Secondary | ICD-10-CM | POA: Diagnosis not present

## 2015-10-18 DIAGNOSIS — R609 Edema, unspecified: Secondary | ICD-10-CM | POA: Diagnosis not present

## 2015-10-18 DIAGNOSIS — I10 Essential (primary) hypertension: Secondary | ICD-10-CM | POA: Diagnosis not present

## 2015-10-25 DIAGNOSIS — R600 Localized edema: Secondary | ICD-10-CM | POA: Diagnosis not present

## 2015-10-29 DIAGNOSIS — I10 Essential (primary) hypertension: Secondary | ICD-10-CM | POA: Diagnosis not present

## 2015-10-29 DIAGNOSIS — I8392 Asymptomatic varicose veins of left lower extremity: Secondary | ICD-10-CM | POA: Diagnosis not present

## 2015-10-29 DIAGNOSIS — I493 Ventricular premature depolarization: Secondary | ICD-10-CM | POA: Diagnosis not present

## 2015-10-29 DIAGNOSIS — R600 Localized edema: Secondary | ICD-10-CM | POA: Diagnosis not present

## 2015-10-29 DIAGNOSIS — M79604 Pain in right leg: Secondary | ICD-10-CM | POA: Diagnosis not present

## 2015-10-29 DIAGNOSIS — I351 Nonrheumatic aortic (valve) insufficiency: Secondary | ICD-10-CM | POA: Diagnosis not present

## 2015-10-29 DIAGNOSIS — M79605 Pain in left leg: Secondary | ICD-10-CM | POA: Diagnosis not present

## 2015-10-30 DIAGNOSIS — Z6827 Body mass index (BMI) 27.0-27.9, adult: Secondary | ICD-10-CM | POA: Diagnosis not present

## 2015-10-30 DIAGNOSIS — M26629 Arthralgia of temporomandibular joint, unspecified side: Secondary | ICD-10-CM | POA: Diagnosis not present

## 2015-10-30 DIAGNOSIS — Z23 Encounter for immunization: Secondary | ICD-10-CM | POA: Diagnosis not present

## 2015-11-08 DIAGNOSIS — Z7952 Long term (current) use of systemic steroids: Secondary | ICD-10-CM | POA: Diagnosis not present

## 2015-11-08 DIAGNOSIS — Z7982 Long term (current) use of aspirin: Secondary | ICD-10-CM | POA: Diagnosis not present

## 2015-11-08 DIAGNOSIS — Z882 Allergy status to sulfonamides status: Secondary | ICD-10-CM | POA: Diagnosis not present

## 2015-11-08 DIAGNOSIS — Z79899 Other long term (current) drug therapy: Secondary | ICD-10-CM | POA: Diagnosis not present

## 2015-11-08 DIAGNOSIS — I83892 Varicose veins of left lower extremities with other complications: Secondary | ICD-10-CM | POA: Diagnosis not present

## 2015-11-08 DIAGNOSIS — G35 Multiple sclerosis: Secondary | ICD-10-CM | POA: Diagnosis not present

## 2015-11-08 DIAGNOSIS — I493 Ventricular premature depolarization: Secondary | ICD-10-CM | POA: Diagnosis not present

## 2015-11-08 DIAGNOSIS — I351 Nonrheumatic aortic (valve) insufficiency: Secondary | ICD-10-CM | POA: Diagnosis not present

## 2015-11-08 DIAGNOSIS — M797 Fibromyalgia: Secondary | ICD-10-CM | POA: Diagnosis not present

## 2015-11-08 DIAGNOSIS — K219 Gastro-esophageal reflux disease without esophagitis: Secondary | ICD-10-CM | POA: Diagnosis not present

## 2015-11-08 DIAGNOSIS — I83812 Varicose veins of left lower extremities with pain: Secondary | ICD-10-CM | POA: Diagnosis not present

## 2015-11-08 DIAGNOSIS — I872 Venous insufficiency (chronic) (peripheral): Secondary | ICD-10-CM | POA: Diagnosis not present

## 2015-11-08 DIAGNOSIS — F419 Anxiety disorder, unspecified: Secondary | ICD-10-CM | POA: Diagnosis not present

## 2015-11-08 DIAGNOSIS — I10 Essential (primary) hypertension: Secondary | ICD-10-CM | POA: Diagnosis not present

## 2015-11-08 DIAGNOSIS — M1990 Unspecified osteoarthritis, unspecified site: Secondary | ICD-10-CM | POA: Diagnosis not present

## 2015-11-08 DIAGNOSIS — Z886 Allergy status to analgesic agent status: Secondary | ICD-10-CM | POA: Diagnosis not present

## 2015-11-12 DIAGNOSIS — R600 Localized edema: Secondary | ICD-10-CM | POA: Diagnosis not present

## 2015-11-12 DIAGNOSIS — Z48812 Encounter for surgical aftercare following surgery on the circulatory system: Secondary | ICD-10-CM | POA: Diagnosis not present

## 2015-11-26 DIAGNOSIS — R600 Localized edema: Secondary | ICD-10-CM | POA: Diagnosis not present

## 2015-11-26 DIAGNOSIS — I10 Essential (primary) hypertension: Secondary | ICD-10-CM | POA: Diagnosis not present

## 2015-11-26 DIAGNOSIS — I8392 Asymptomatic varicose veins of left lower extremity: Secondary | ICD-10-CM | POA: Diagnosis not present

## 2015-11-26 DIAGNOSIS — M79604 Pain in right leg: Secondary | ICD-10-CM | POA: Diagnosis not present

## 2015-11-26 DIAGNOSIS — M79605 Pain in left leg: Secondary | ICD-10-CM | POA: Diagnosis not present

## 2015-11-26 DIAGNOSIS — I493 Ventricular premature depolarization: Secondary | ICD-10-CM | POA: Diagnosis not present

## 2015-11-26 DIAGNOSIS — I351 Nonrheumatic aortic (valve) insufficiency: Secondary | ICD-10-CM | POA: Diagnosis not present

## 2015-11-30 DIAGNOSIS — E782 Mixed hyperlipidemia: Secondary | ICD-10-CM | POA: Diagnosis not present

## 2015-11-30 DIAGNOSIS — M810 Age-related osteoporosis without current pathological fracture: Secondary | ICD-10-CM | POA: Diagnosis not present

## 2015-11-30 DIAGNOSIS — G35 Multiple sclerosis: Secondary | ICD-10-CM | POA: Diagnosis not present

## 2015-11-30 DIAGNOSIS — I10 Essential (primary) hypertension: Secondary | ICD-10-CM | POA: Diagnosis not present

## 2015-11-30 DIAGNOSIS — M05419 Rheumatoid myopathy with rheumatoid arthritis of unspecified shoulder: Secondary | ICD-10-CM | POA: Diagnosis not present

## 2015-11-30 DIAGNOSIS — F331 Major depressive disorder, recurrent, moderate: Secondary | ICD-10-CM | POA: Diagnosis not present

## 2015-12-04 DIAGNOSIS — I872 Venous insufficiency (chronic) (peripheral): Secondary | ICD-10-CM | POA: Diagnosis not present

## 2015-12-04 DIAGNOSIS — M1990 Unspecified osteoarthritis, unspecified site: Secondary | ICD-10-CM | POA: Diagnosis not present

## 2015-12-04 DIAGNOSIS — R2681 Unsteadiness on feet: Secondary | ICD-10-CM | POA: Diagnosis not present

## 2015-12-05 DIAGNOSIS — M79644 Pain in right finger(s): Secondary | ICD-10-CM | POA: Diagnosis not present

## 2015-12-05 DIAGNOSIS — M654 Radial styloid tenosynovitis [de Quervain]: Secondary | ICD-10-CM | POA: Diagnosis not present

## 2015-12-05 DIAGNOSIS — R110 Nausea: Secondary | ICD-10-CM | POA: Diagnosis not present

## 2015-12-05 DIAGNOSIS — W08XXXA Fall from other furniture, initial encounter: Secondary | ICD-10-CM | POA: Diagnosis not present

## 2015-12-06 DIAGNOSIS — I6529 Occlusion and stenosis of unspecified carotid artery: Secondary | ICD-10-CM | POA: Diagnosis not present

## 2015-12-06 DIAGNOSIS — S0990XA Unspecified injury of head, initial encounter: Secondary | ICD-10-CM | POA: Diagnosis not present

## 2015-12-06 DIAGNOSIS — G319 Degenerative disease of nervous system, unspecified: Secondary | ICD-10-CM | POA: Diagnosis not present

## 2015-12-06 DIAGNOSIS — R51 Headache: Secondary | ICD-10-CM | POA: Diagnosis not present

## 2015-12-11 DIAGNOSIS — M1990 Unspecified osteoarthritis, unspecified site: Secondary | ICD-10-CM | POA: Diagnosis not present

## 2015-12-11 DIAGNOSIS — I872 Venous insufficiency (chronic) (peripheral): Secondary | ICD-10-CM | POA: Diagnosis not present

## 2015-12-11 DIAGNOSIS — R2681 Unsteadiness on feet: Secondary | ICD-10-CM | POA: Diagnosis not present

## 2015-12-13 DIAGNOSIS — M1990 Unspecified osteoarthritis, unspecified site: Secondary | ICD-10-CM | POA: Diagnosis not present

## 2015-12-13 DIAGNOSIS — R2681 Unsteadiness on feet: Secondary | ICD-10-CM | POA: Diagnosis not present

## 2015-12-13 DIAGNOSIS — I872 Venous insufficiency (chronic) (peripheral): Secondary | ICD-10-CM | POA: Diagnosis not present

## 2015-12-14 DIAGNOSIS — Z6827 Body mass index (BMI) 27.0-27.9, adult: Secondary | ICD-10-CM | POA: Diagnosis not present

## 2015-12-14 DIAGNOSIS — M1711 Unilateral primary osteoarthritis, right knee: Secondary | ICD-10-CM | POA: Diagnosis not present

## 2015-12-14 DIAGNOSIS — F331 Major depressive disorder, recurrent, moderate: Secondary | ICD-10-CM | POA: Diagnosis not present

## 2015-12-14 DIAGNOSIS — M797 Fibromyalgia: Secondary | ICD-10-CM | POA: Diagnosis not present

## 2015-12-14 DIAGNOSIS — M810 Age-related osteoporosis without current pathological fracture: Secondary | ICD-10-CM | POA: Diagnosis not present

## 2015-12-14 DIAGNOSIS — I10 Essential (primary) hypertension: Secondary | ICD-10-CM | POA: Diagnosis not present

## 2015-12-14 DIAGNOSIS — G35 Multiple sclerosis: Secondary | ICD-10-CM | POA: Diagnosis not present

## 2015-12-18 DIAGNOSIS — I872 Venous insufficiency (chronic) (peripheral): Secondary | ICD-10-CM | POA: Diagnosis not present

## 2015-12-18 DIAGNOSIS — R2681 Unsteadiness on feet: Secondary | ICD-10-CM | POA: Diagnosis not present

## 2015-12-18 DIAGNOSIS — M461 Sacroiliitis, not elsewhere classified: Secondary | ICD-10-CM | POA: Diagnosis not present

## 2015-12-18 DIAGNOSIS — M1990 Unspecified osteoarthritis, unspecified site: Secondary | ICD-10-CM | POA: Diagnosis not present

## 2015-12-20 DIAGNOSIS — R2681 Unsteadiness on feet: Secondary | ICD-10-CM | POA: Diagnosis not present

## 2015-12-20 DIAGNOSIS — I872 Venous insufficiency (chronic) (peripheral): Secondary | ICD-10-CM | POA: Diagnosis not present

## 2015-12-20 DIAGNOSIS — M1990 Unspecified osteoarthritis, unspecified site: Secondary | ICD-10-CM | POA: Diagnosis not present

## 2015-12-27 DIAGNOSIS — M1990 Unspecified osteoarthritis, unspecified site: Secondary | ICD-10-CM | POA: Diagnosis not present

## 2015-12-27 DIAGNOSIS — I872 Venous insufficiency (chronic) (peripheral): Secondary | ICD-10-CM | POA: Diagnosis not present

## 2015-12-27 DIAGNOSIS — R2681 Unsteadiness on feet: Secondary | ICD-10-CM | POA: Diagnosis not present

## 2016-01-10 ENCOUNTER — Ambulatory Visit (INDEPENDENT_AMBULATORY_CARE_PROVIDER_SITE_OTHER): Payer: Medicare Other | Admitting: Neurology

## 2016-01-10 ENCOUNTER — Encounter: Payer: Self-pay | Admitting: Neurology

## 2016-01-10 ENCOUNTER — Telehealth: Payer: Self-pay

## 2016-01-10 VITALS — BP 128/70 | HR 72 | Ht 66.0 in | Wt 170.0 lb

## 2016-01-10 DIAGNOSIS — R269 Unspecified abnormalities of gait and mobility: Secondary | ICD-10-CM | POA: Diagnosis not present

## 2016-01-10 DIAGNOSIS — G35 Multiple sclerosis: Secondary | ICD-10-CM | POA: Diagnosis not present

## 2016-01-10 MED ORDER — DIAZEPAM 2 MG PO TABS: 2.0000 mg | ORAL_TABLET | Freq: Three times a day (TID) | ORAL | 3 refills | Status: DC

## 2016-01-10 MED ORDER — BUPROPION HCL ER (SR) 150 MG PO TB12: 150.0000 mg | ORAL_TABLET | Freq: Every day | ORAL | 3 refills | Status: DC

## 2016-01-10 NOTE — Telephone Encounter (Signed)
Pt no-showed today's follow-up appt.

## 2016-01-10 NOTE — Patient Instructions (Signed)
  Stop the Effexor 75 mg, and we will start Wellbutrin 150 mg daily, and diazepam 2 mg three times a day.

## 2016-01-10 NOTE — Telephone Encounter (Signed)
Pt arrived 20 min late.

## 2016-01-10 NOTE — Progress Notes (Signed)
Reason for visit: Multiple sclerosis  Stephanie Perez is an 75 y.o. female  History of present illness:  Ms. onset is a 75 year old right-handed white female with a history of chronic progressive multiple sclerosis. The patient has developed a spastic paraparesis, she can walk short distances in the home environment with a walker. She does fall on occasion. She has significant stiffness of the legs, left greater than right. She is on very low-dose baclofen, taking only 5 mg twice a day. The patient has gained benefit from diazepam in the past with her mood but also with the walking and leg spasticity. She has chronic low back pain and some leg pain as well. 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 She has a neurogenic bladder, she gets up 4 or 5 times at night to urinate, and she upper some urinary incontinence. She is depressed regarding this, she is on Effexor but cannot take a higher dose other than 75 mg as she becomes dizzy on higher doses. The patient returns to this office for an evaluation.  Past Medical History:  Diagnosis Date  . Abnormality of gait   . Anxiety   . Depression   . Fibromyalgia   . Low back pain 10/31/2014  . Lumbago   . Multiple sclerosis ([place])   . Neurogenic bladder   . Rheumatoid arteritis   . Spastic paraparesis     Past Surgical History:  Procedure Laterality Date  . ABDOMINAL HYSTERECTOMY    . APPENDECTOMY    . FRACTURE SURGERY    . GALLBLADDER SURGERY N/A   . VASCULAR SURGERY Left    Lower extremity    Family History  Problem Relation Age of Onset  . Lung cancer Mother   . Heart disease Father   . Other Father   . Multiple sclerosis Brother     Social history:  reports that she has never smoked. She has never used smokeless tobacco. She reports that she does not drink alcohol or use drugs.    Allergies  Allergen Reactions  . Bactrim [Sulfamethoxazole-Trimethoprim]   . Codeine  Sulfate Nausea And Vomiting  . Lyrica [Pregabalin] Other (See Comments)    Chest pain, difficulty swallowing  . Zanaflex [Tizanidine Hcl]   . Sulfa Antibiotics Diarrhea    Medications:  Prior to Admission medications   Medication Sig Start Date End Date Taking? Authorizing Provider  aspirin 81 MG tablet Take 81 mg by mouth daily.    Historical Provider, MD  baclofen (LIORESAL) 10 MG tablet Take 0.5 tablets (5 mg total) by mouth 2 (two) times daily. 08/17/15   Kathrynn Ducking, MD  clonazePAM (KLONOPIN) 0.5 MG tablet Take 0.5 mg by mouth at bedtime.     Historical Provider, MD  estradiol (ESTRACE) 0.5 MG tablet Take 1 tablet by mouth daily. 01/03/15   Historical Provider, MD  metoprolol tartrate (LOPRESSOR) 25 MG tablet  07/14/13   Historical Provider, MD  mirabegron ER (MYRBETRIQ) 50 MG TB24 tablet Take 50 mg by mouth daily.    Historical Provider, MD  omeprazole (PRILOSEC) 20 MG capsule Take 1 capsule (20 mg total) by mouth daily. 05/19/12   Kathrynn Ducking, MD  predniSONE (DELTASONE) 5 MG tablet Take 1 tablet (5 mg total) by mouth daily with breakfast. 02/13/15   Ward Givens, NP  traMADol (ULTRAM) 50 MG tablet Take 50 mg by mouth every 6 (six) hours as needed for pain.  Historical Provider, MD  valsartan-hydrochlorothiazide (DIOVAN-HCT) 320-25 MG tablet Take 1 tablet by mouth daily. 11/23/14   Historical Provider, MD  venlafaxine XR (EFFEXOR-XR) 150 MG 24 hr capsule Take 150 mg by mouth at bedtime. 06/15/14   Historical Provider, MD    ROS:  Out of a complete 14 system review of symptoms, the patient complains only of the following symptoms, and all other reviewed systems are negative.  Fatigue Eye discomfort, dryness of the eyes Heat intolerance Constipation, heartburn Restless legs, sleep talking Frequency of urination, urinary incontinence Joint pain, back pain, muscle pain, neck pain, neck stiffness Numbness, weakness Depression, anxiety, suicidal thoughts  Blood pressure  128/70, pulse 72, height 5\' 6"  (1.676 m), weight 170 lb (77.1 kg).  Physical Exam  General: The patient is alert and cooperative at the time of the examination.  Skin: No significant peripheral edema is noted.   Neurologic Exam  Mental status: The patient is alert and oriented x 3 at the time of the examination. The patient has apparent normal recent and remote memory, with an apparently normal attention span and concentration ability.   Cranial nerves: Facial symmetry is present. Speech is normal, no aphasia or dysarthria is noted. Extraocular movements are full. Visual fields are full.  Motor: The patient has good strength in the upper extremities. With the lower extremities, she has increased motor tone bilaterally, difficulty with flexion and extension at the hips and knees on the left greater than right.  Sensory examination: Soft touch sensation is symmetric on the face, arms, and legs.  Coordination: The patient has good finger-nose-finger, but she cannot perform heel-to-shin bilaterally.  Gait and station: The patient has very slow, diplegia gait, she can walk only with assistance.  Reflexes: Deep tendon reflexes are symmetric.   Assessment/Plan:  1. Multiple sclerosis  2. Gait disorder, spastic diplegia  3. Paraparesis  4. Neurogenic bladder  The patient is having significant spasticity in the legs. The patient has gained some benefit with diazepam in the past, we will restart this medication at 2 mg 3 times daily. She is not to take the clonazepam if she is on diazepam. The patient will be taken off of the Effexor and Wellbutrin will be started at 150 mg daily, we may go up on the dose if she tolerates this. The patient will follow-up in about 3 or 4 months. We discussed briefly about the possibility of going on Ocrevus for the multiple sclerosis, she currently is not on any disease modifying agents.   Jill Alexanders MD 01/10/2016 3:01 PM  [facility] [address] [place], [place] [address]  Phone [phone] Fax [phone]

## 2016-01-17 DIAGNOSIS — I10 Essential (primary) hypertension: Secondary | ICD-10-CM | POA: Diagnosis not present

## 2016-01-17 DIAGNOSIS — M25552 Pain in left hip: Secondary | ICD-10-CM | POA: Diagnosis not present

## 2016-01-17 DIAGNOSIS — G35 Multiple sclerosis: Secondary | ICD-10-CM | POA: Diagnosis not present

## 2016-01-17 DIAGNOSIS — M25572 Pain in left ankle and joints of left foot: Secondary | ICD-10-CM | POA: Diagnosis not present

## 2016-01-18 ENCOUNTER — Telehealth: Payer: Self-pay

## 2016-01-18 NOTE — Telephone Encounter (Signed)
[id number]; Product Name:ST: High Risk Medications - Benzodiazepines (Valium) Status: Approved; Coverage Start Date: 12/19/2015; Coverage End Date:01/17/2017;

## 2016-01-29 ENCOUNTER — Telehealth: Payer: Self-pay | Admitting: Neurology

## 2016-01-29 NOTE — Telephone Encounter (Signed)
Patient fell 4 times the week of 01-14-16 and saw Dr.Jay in [place]. He did X-rays and there were no broken bones but she is badly bruised on the inside and cannot walk. Her BP was real high so she was given 1 pill of BP medication to lower BP but she does not know the name. He also gave her Prednisone 10mg  #48 which has helped and she has 2 pills left. Should she continue taking Prednisone 10mg ? If so a Rx will need to be called to [facility] in [place] tel# [phone]. Please call patient and discuss.

## 2016-01-29 NOTE — Telephone Encounter (Signed)
I called the patient, left a message, I'll call back later. 

## 2016-01-29 NOTE — Telephone Encounter (Signed)
I called the patient again, left a message, I'll try to call back tomorrow morning.

## 2016-01-30 MED ORDER — PREDNISONE 10 MG PO TABS: ORAL_TABLET | ORAL | 0 refills | Status: DC

## 2016-01-30 NOTE — Telephone Encounter (Signed)
Called and spoke to pt's husband. Appt scheduled for Mon 02/18/15 w/ 10:45 arrival time.

## 2016-01-30 NOTE — Addendum Note (Signed)
Addended by: Margette Fast on: 01/30/2016 08:55 AM   Modules accepted: Orders

## 2016-01-30 NOTE — Telephone Encounter (Signed)
I called patient. The patient has had an exacerbation of her MS beginning on 01/17/2016. The patient feels weak all over, unable to ambulate. A prednisone taper did help her, she is coming off of this now and starting to feel weaker. When she went in for medical evaluation, no urinalysis or blood work was done. The patient denies any fevers or chills or symptoms of a bladder infection. I will call in another runs on Dosepak, but we will need to see her in the office.

## 2016-02-07 DIAGNOSIS — M1711 Unilateral primary osteoarthritis, right knee: Secondary | ICD-10-CM | POA: Diagnosis not present

## 2016-02-07 DIAGNOSIS — M545 Low back pain: Secondary | ICD-10-CM | POA: Diagnosis not present

## 2016-02-07 DIAGNOSIS — G35 Multiple sclerosis: Secondary | ICD-10-CM | POA: Diagnosis not present

## 2016-02-07 DIAGNOSIS — M05419 Rheumatoid myopathy with rheumatoid arthritis of unspecified shoulder: Secondary | ICD-10-CM | POA: Diagnosis not present

## 2016-02-07 DIAGNOSIS — E782 Mixed hyperlipidemia: Secondary | ICD-10-CM | POA: Diagnosis not present

## 2016-02-07 DIAGNOSIS — M797 Fibromyalgia: Secondary | ICD-10-CM | POA: Diagnosis not present

## 2016-02-07 DIAGNOSIS — F331 Major depressive disorder, recurrent, moderate: Secondary | ICD-10-CM | POA: Diagnosis not present

## 2016-02-07 DIAGNOSIS — I10 Essential (primary) hypertension: Secondary | ICD-10-CM | POA: Diagnosis not present

## 2016-02-07 DIAGNOSIS — M810 Age-related osteoporosis without current pathological fracture: Secondary | ICD-10-CM | POA: Diagnosis not present

## 2016-02-13 DIAGNOSIS — H04123 Dry eye syndrome of bilateral lacrimal glands: Secondary | ICD-10-CM | POA: Diagnosis not present

## 2016-02-15 ENCOUNTER — Telehealth: Payer: Self-pay | Admitting: Neurology

## 2016-02-15 NOTE — Telephone Encounter (Signed)
I called patient, I left a message. I do not think we can continue to give high-dose prednisone Dosepaks, but the patient could potentially benefit from a low dose of daily prednisone such as 5 mg. I will discuss this with her on the next revisit.

## 2016-02-15 NOTE — Telephone Encounter (Signed)
Stephanie Perez is an MS patient who has recently completed a couple of steroid dosepaks due to exacerbation. Reports increase in falls each time she has finished the prednisone. A follow-up appt is scheduled for Monday.

## 2016-02-15 NOTE — Telephone Encounter (Signed)
Patient is calling to discuss predniSONE (DELTASONE) 10 MG tablet. She says since taking this medication it makes all the difference in the world in helping her feel better and gives her strength. She says she fell 4 times yesterday. The patient uses [facility] in [place] on [address].

## 2016-02-18 ENCOUNTER — Ambulatory Visit (INDEPENDENT_AMBULATORY_CARE_PROVIDER_SITE_OTHER): Payer: Medicare Other | Admitting: Neurology

## 2016-02-18 ENCOUNTER — Encounter: Payer: Self-pay | Admitting: Neurology

## 2016-02-18 VITALS — BP 123/64 | HR 58 | Ht 66.0 in | Wt 166.0 lb

## 2016-02-18 DIAGNOSIS — G35 Multiple sclerosis: Secondary | ICD-10-CM

## 2016-02-18 DIAGNOSIS — Z5181 Encounter for therapeutic drug level monitoring: Secondary | ICD-10-CM

## 2016-02-18 DIAGNOSIS — R269 Unspecified abnormalities of gait and mobility: Secondary | ICD-10-CM

## 2016-02-18 MED ORDER — PREDNISONE 5 MG PO TABS: 5.0000 mg | ORAL_TABLET | Freq: Every day | ORAL | 3 refills | Status: DC

## 2016-02-18 NOTE — Progress Notes (Signed)
Reason for visit: Multiple sclerosis  Stephanie Perez is an 76 y.o. female  History of present illness:  Stephanie Perez is a 48 year old right-handed white female with a history of multiple sclerosis associated with significant spasticity of both legs, and with difficulty with walking. The patient is on low-dose baclofen taking 5 mg twice daily. The patient has gained benefit from prednisone since she has had an exacerbation in her walking problem since 01/17/2016. The patient is able to walk very short distances with a walker, she generally will use a scooter in the home environment. The patient has fallen, she has bumped her head recently, she had some neck stiffness which has improved. The patient has a neurogenic bowel and bladder issue, this is stable. She denies any new symptoms of numbness or weakness on the arms or changes in vision. The patient returns for an evaluation.  Past Medical History:  Diagnosis Date  . Abnormality of gait   . Anxiety   . Depression   . Fibromyalgia   . Low back pain 10/31/2014  . Lumbago   . Multiple sclerosis ([place])   . Neurogenic bladder   . Rheumatoid arteritis   . Spastic paraparesis     Past Surgical History:  Procedure Laterality Date  . ABDOMINAL HYSTERECTOMY    . APPENDECTOMY    . FRACTURE SURGERY    . GALLBLADDER SURGERY N/A   . VASCULAR SURGERY Left    Lower extremity    Family History  Problem Relation Age of Onset  . Lung cancer Mother   . Heart disease Father   . Other Father   . Multiple sclerosis Brother     Social history:  reports that she has never smoked. She has never used smokeless tobacco. She reports that she does not drink alcohol or use drugs.    Allergies  Allergen Reactions  . Bactrim [Sulfamethoxazole-Trimethoprim]   . Codeine Sulfate Nausea And Vomiting  . Lyrica [Pregabalin] Other (See Comments)    Chest pain, difficulty swallowing  . Zanaflex [Tizanidine Hcl]   . Sulfa Antibiotics Diarrhea     Medications:  Prior to Admission medications   Medication Sig Start Date End Date Taking? Authorizing Provider  aspirin 81 MG tablet Take 81 mg by mouth daily.   Yes Historical Provider, MD  baclofen (LIORESAL) 10 MG tablet Take 0.5 tablets (5 mg total) by mouth 2 (two) times daily. Patient taking differently: Take 2.5 mg by mouth 2 (two) times daily.  08/17/15  Yes Kathrynn Ducking, MD  clonazePAM (KLONOPIN) 0.5 MG tablet Take 0.5 mg by mouth at bedtime.    Yes Historical Provider, MD  estradiol (ESTRACE) 0.5 MG tablet Take 1 tablet by mouth daily. 01/03/15  Yes Historical Provider, MD  metoprolol tartrate (LOPRESSOR) 25 MG tablet Take 25 mg by mouth daily.  07/14/13  Yes Historical Provider, MD  mirabegron ER (MYRBETRIQ) 50 MG TB24 tablet Take 50 mg by mouth daily.   Yes Historical Provider, MD  omeprazole (PRILOSEC) 20 MG capsule Take 1 capsule (20 mg total) by mouth daily. 05/19/12  Yes Kathrynn Ducking, MD  traMADol (ULTRAM) 50 MG tablet Take 50 mg by mouth every 6 (six) hours as needed for pain.   Yes Historical Provider, MD  valsartan-hydrochlorothiazide (DIOVAN-HCT) 320-25 MG tablet Take 1 tablet by mouth daily. 11/23/14  Yes Historical Provider, MD  venlafaxine XR (EFFEXOR-XR) 75 MG 24 hr capsule  01/13/16  Yes Historical Provider, MD  predniSONE (DELTASONE) 5 MG tablet Take  1 tablet (5 mg total) by mouth daily with breakfast. 02/18/16   Kathrynn Ducking, MD    ROS:  Out of a complete 14 system review of symptoms, the patient complains only of the following symptoms, and all other reviewed systems are negative.  Excessive thirst Gait instability Leg stiffness  Blood pressure 123/64, pulse (!) 58, height 5\' 6"  (1.676 m), weight 166 lb (75.3 kg).  Physical Exam  General: The patient is alert and cooperative at the time of the examination.  Skin: 1+ edema at the ankles has been noted.   Neurologic Exam  Mental status: The patient is alert and oriented x 3 at the time of the  examination. The patient has apparent normal recent and remote memory, with an apparently normal attention span and concentration ability.   Cranial nerves: Facial symmetry is present. Speech is normal, no aphasia or dysarthria is noted. Extraocular movements are full. Visual fields are full.  Motor: The patient has good strength in the upper extremities. With the lower extremity, there is increased motor tone, generalized 4/5 strength.  Sensory examination: Soft touch sensation is symmetric on the face, arms, and legs.  Coordination: The patient has good finger-nose-finger bilaterally. The patient is not able to perform heel-to-shin on either side.  Gait and station: The patient requires some assistance with standing, once up, she can take only a few steps with assistance, significant diplegic gait noted.  Reflexes: Deep tendon reflexes are symmetric.   Assessment/Plan:  1. Multiple sclerosis  2. Spastic paraparesis  3. Gait disturbance  The patient has had some worsening function recently beginning around 01/17/2016. The patient has responded some to prednisone, we will place her on a 5 mg daily maintenance dose. The patient may be a candidate for Ocrevus, blood work will be done today, we will try to get treatment set up for her, she will follow-up in 4 months.  Jill Alexanders MD 02/18/2016 11:59 AM  [facility] [address] [place], [place] [address]  Phone [phone] Fax [phone]

## 2016-02-18 NOTE — Patient Instructions (Signed)
   We will start prednisone 5 mg daily. We will try to get Ocrevus set up.

## 2016-02-19 ENCOUNTER — Telehealth: Payer: Self-pay

## 2016-02-19 LAB — HEPATITIS B SURFACE ANTIGEN: Hepatitis B Surface Ag: NEGATIVE

## 2016-02-19 LAB — COMPREHENSIVE METABOLIC PANEL
A/G RATIO: 1.8 (ref 1.2–2.2)
ALBUMIN: 3.5 g/dL (ref 3.5–4.8)
ALT: 33 IU/L — ABNORMAL HIGH (ref 0–32)
AST: 24 IU/L (ref 0–40)
Alkaline Phosphatase: 55 IU/L (ref 39–117)
BILIRUBIN TOTAL: 0.3 mg/dL (ref 0.0–1.2)
BUN / CREAT RATIO: 19 (ref 12–28)
BUN: 15 mg/dL (ref 8–27)
CHLORIDE: 98 mmol/L (ref 96–106)
CO2: 29 mmol/L (ref 18–29)
Calcium: 9.5 mg/dL (ref 8.7–10.3)
Creatinine, Ser: 0.81 mg/dL (ref 0.57–1.00)
GFR calc non Af Amer: 71 mL/min/{1.73_m2} (ref >59)
GFR, EST AFRICAN AMERICAN: 82 mL/min/{1.73_m2} (ref >59)
GLOBULIN, TOTAL: 1.9 g/dL (ref 1.5–4.5)
Glucose: 78 mg/dL (ref 65–99)
POTASSIUM: 3.4 mmol/L — AB (ref 3.5–5.2)
Sodium: 141 mmol/L (ref 134–144)
TOTAL PROTEIN: 5.4 g/dL — AB (ref 6.0–8.5)

## 2016-02-19 LAB — CBC WITH DIFFERENTIAL/PLATELET
BASOS ABS: 0 10*3/uL (ref 0.0–0.2)
Basos: 0 %
EOS (ABSOLUTE): 0.1 10*3/uL (ref 0.0–0.4)
Eos: 2 %
HEMOGLOBIN: 12 g/dL (ref 11.1–15.9)
Hematocrit: 36.9 % (ref 34.0–46.6)
Immature Grans (Abs): 0.1 10*3/uL (ref 0.0–0.1)
Immature Granulocytes: 1 %
LYMPHS ABS: 1.9 10*3/uL (ref 0.7–3.1)
Lymphs: 22 %
MCH: 29.2 pg (ref 26.6–33.0)
MCHC: 32.5 g/dL (ref 31.5–35.7)
MCV: 90 fL (ref 79–97)
Monocytes Absolute: 1 10*3/uL — ABNORMAL HIGH (ref 0.1–0.9)
Monocytes: 12 %
NEUTROS ABS: 5.4 10*3/uL (ref 1.4–7.0)
Neutrophils: 63 %
Platelets: 198 10*3/uL (ref 150–379)
RBC: 4.11 x10E6/uL (ref 3.77–5.28)
RDW: 14.3 % (ref 12.3–15.4)
WBC: 8.3 10*3/uL (ref 3.4–10.8)

## 2016-02-19 LAB — HEPATITIS B CORE ANTIBODY, TOTAL: HEP B C TOTAL AB: NEGATIVE

## 2016-02-19 LAB — VARICELLA ZOSTER ANTIBODY, IGG: Varicella zoster IgG: 1595 index (ref >165)

## 2016-02-19 LAB — HEPATITIS C ANTIBODY: Hep C Virus Ab: <0.1 s/co ratio (ref 0.0–0.9)

## 2016-02-19 NOTE — Telephone Encounter (Signed)
-----   Message from Kathrynn Ducking, MD sent at 02/19/2016 11:46 AM EST ----- Blood work is relatively unremarkable with exception of a minimally low potassium level and a slightly low total protein level and very minimally elevated ALT level, these issues are not clinically significant. CBC is unremarkable. Please call the patient. ----- Message ----- From: Lavone Neri Lab Results In Sent: 02/19/2016   7:41 AM To: Kathrynn Ducking, MD

## 2016-02-19 NOTE — Telephone Encounter (Signed)
Called pt w/ lab results. Verbalized understanding and appreciation for call. Voiced concerns about starting new MS med due to possible side effects. Offered encouragement and allowed pt to vent feelings. Plans on trying Ocrevus "with lots of prayer."

## 2016-03-03 DIAGNOSIS — M545 Low back pain: Secondary | ICD-10-CM | POA: Diagnosis not present

## 2016-03-03 DIAGNOSIS — S7002XA Contusion of left hip, initial encounter: Secondary | ICD-10-CM | POA: Diagnosis not present

## 2016-03-14 DIAGNOSIS — M797 Fibromyalgia: Secondary | ICD-10-CM | POA: Diagnosis not present

## 2016-03-14 DIAGNOSIS — R52 Pain, unspecified: Secondary | ICD-10-CM | POA: Diagnosis not present

## 2016-03-14 DIAGNOSIS — G35 Multiple sclerosis: Secondary | ICD-10-CM | POA: Diagnosis not present

## 2016-03-14 DIAGNOSIS — I10 Essential (primary) hypertension: Secondary | ICD-10-CM | POA: Diagnosis not present

## 2016-03-14 DIAGNOSIS — Z882 Allergy status to sulfonamides status: Secondary | ICD-10-CM | POA: Diagnosis not present

## 2016-03-14 DIAGNOSIS — Z886 Allergy status to analgesic agent status: Secondary | ICD-10-CM | POA: Diagnosis not present

## 2016-03-14 DIAGNOSIS — J101 Influenza due to other identified influenza virus with other respiratory manifestations: Secondary | ICD-10-CM | POA: Diagnosis not present

## 2016-03-14 DIAGNOSIS — M1990 Unspecified osteoarthritis, unspecified site: Secondary | ICD-10-CM | POA: Diagnosis not present

## 2016-03-24 DIAGNOSIS — N3001 Acute cystitis with hematuria: Secondary | ICD-10-CM | POA: Diagnosis not present

## 2016-03-25 DIAGNOSIS — N3001 Acute cystitis with hematuria: Secondary | ICD-10-CM | POA: Diagnosis not present

## 2016-03-27 ENCOUNTER — Telehealth: Payer: Self-pay | Admitting: Neurology

## 2016-03-27 MED ORDER — HYDROCODONE-ACETAMINOPHEN 5-325 MG PO TABS: 1.0000 | ORAL_TABLET | Freq: Four times a day (QID) | ORAL | 0 refills | Status: DC | PRN

## 2016-03-27 NOTE — Addendum Note (Signed)
Addended by: Margette Fast on: 03/27/2016 05:29 PM   Modules accepted: Orders

## 2016-03-27 NOTE — Telephone Encounter (Signed)
For the last 3-4 weeks patient has had pain between her shoulder blades. Could this be coming from [place]? She went to the ED in [place] the first of February and was diagnosed with the flu and given Tamiflu..  2 days ago she went to an urgent care and was diagnosed with a UTI and was given an antibiotic. She says she hurts all over.Please call and discuss.

## 2016-03-27 NOTE — Telephone Encounter (Signed)
I called patient. The patient has had some discomfort throughout the body for quite a number of years, likely associated with the multiple sclerosis. Within the last 2 weeks, she has felt an increase in the discomfort, she has been found to have an influenza infection, she also had a bladder infection around the same time. Full recovery from this may take 3-4 weeks.  The patient has used some hydrocodone from her husband, her Ultram is not helping the pain. I will write a small prescription for hydrocodone on a one-time basis only. The patient will come by to pick this up.  The patient talked about potentially going to see a rheumatologist for the pain, I recommended holding off on this until she has fully recovered from the flu.

## 2016-03-27 NOTE — Telephone Encounter (Signed)
Dr Jannifer Franklin- please advise. You last saw pt on 02/18/16

## 2016-03-28 NOTE — Telephone Encounter (Signed)
Called and LVM that rx ready to be picked up at front desk. Advised office closes at 12pm today and we are open 8-5pm Monday.

## 2016-03-31 ENCOUNTER — Encounter: Payer: Self-pay | Admitting: *Deleted

## 2016-04-02 NOTE — Telephone Encounter (Signed)
Patients husband called in reference to HYDROcodone-acetaminophen (NORCO/VICODIN) 5-325 MG tablet prescription that is ready for pick up.  He states if the patient will be taking the medication every 6 hrs she will need 90 day supply not the 30 day supply.  Patients husband was also wanting to know if they can send this for mail in order.  Please call

## 2016-04-02 NOTE — Telephone Encounter (Signed)
Called and spoke to husband. Advised we usually only provide 30 day supply at a time and cannot be sent into pharmacy electronically since it is a controlled substance.   He is wondering if Dr Jannifer Franklin can do an approval for 90 day supply because they use [facility]. Husband states they have not been to office to pick up written rx from 03/27/16 because she has been in a lot of pain.  I educated husband on medication overuse and addicting aspects of a controlled substance.  Rx directions read 1 tablet every 6 hr as needed. She should take only as needed, not every 6 hour. He feels she may use the prescription up before the month is over. I again educated that she should only be taking as needed. Advised I will send message to Dr Jannifer Franklin to see how he would like to proceed.

## 2016-04-02 NOTE — Telephone Encounter (Signed)
I called the patient. The patient or her husband needs to come down to pick up the prescription for hydrocodone.  We will not give a 90 day supply for the medication. The medication will be given one month at a time.

## 2016-04-04 DIAGNOSIS — N390 Urinary tract infection, site not specified: Secondary | ICD-10-CM | POA: Diagnosis not present

## 2016-04-07 DIAGNOSIS — R300 Dysuria: Secondary | ICD-10-CM | POA: Diagnosis not present

## 2016-04-07 DIAGNOSIS — N3000 Acute cystitis without hematuria: Secondary | ICD-10-CM | POA: Diagnosis not present

## 2016-04-15 DIAGNOSIS — Z1231 Encounter for screening mammogram for malignant neoplasm of breast: Secondary | ICD-10-CM | POA: Diagnosis not present

## 2016-04-15 DIAGNOSIS — K5900 Constipation, unspecified: Secondary | ICD-10-CM | POA: Diagnosis not present

## 2016-04-15 DIAGNOSIS — N905 Atrophy of vulva: Secondary | ICD-10-CM | POA: Diagnosis not present

## 2016-04-15 DIAGNOSIS — Z789 Other specified health status: Secondary | ICD-10-CM | POA: Diagnosis not present

## 2016-04-15 DIAGNOSIS — Z719 Counseling, unspecified: Secondary | ICD-10-CM | POA: Diagnosis not present

## 2016-04-18 DIAGNOSIS — M79605 Pain in left leg: Secondary | ICD-10-CM | POA: Diagnosis not present

## 2016-04-18 DIAGNOSIS — M79604 Pain in right leg: Secondary | ICD-10-CM | POA: Diagnosis not present

## 2016-04-18 DIAGNOSIS — R600 Localized edema: Secondary | ICD-10-CM | POA: Diagnosis not present

## 2016-04-18 DIAGNOSIS — I493 Ventricular premature depolarization: Secondary | ICD-10-CM | POA: Diagnosis not present

## 2016-04-18 DIAGNOSIS — I10 Essential (primary) hypertension: Secondary | ICD-10-CM | POA: Diagnosis not present

## 2016-04-18 DIAGNOSIS — I8392 Asymptomatic varicose veins of left lower extremity: Secondary | ICD-10-CM | POA: Diagnosis not present

## 2016-04-18 DIAGNOSIS — I351 Nonrheumatic aortic (valve) insufficiency: Secondary | ICD-10-CM | POA: Diagnosis not present

## 2016-04-22 DIAGNOSIS — M79604 Pain in right leg: Secondary | ICD-10-CM | POA: Diagnosis not present

## 2016-04-22 DIAGNOSIS — M79605 Pain in left leg: Secondary | ICD-10-CM | POA: Diagnosis not present

## 2016-04-24 ENCOUNTER — Telehealth: Payer: Self-pay | Admitting: Neurology

## 2016-04-24 DIAGNOSIS — M25512 Pain in left shoulder: Principal | ICD-10-CM

## 2016-04-24 DIAGNOSIS — M25511 Pain in right shoulder: Secondary | ICD-10-CM

## 2016-04-24 MED ORDER — VENLAFAXINE HCL ER 150 MG PO CP24: 150.0000 mg | ORAL_CAPSULE | Freq: Every day | ORAL | 3 refills | Status: AC

## 2016-04-24 NOTE — Telephone Encounter (Signed)
I called patient. The patient has had a chronic issue with diffuse pain throughout. His may be related to the multiple sclerosis, she is concerned she may have rheumatoid arthritis. I will put in blood work orders for this, she will come down for the blood work.  The patient will be increased on her Effexor taking 150 mg daily to help the pain as well.

## 2016-04-24 NOTE — Addendum Note (Signed)
Addended by: Margette Fast on: 04/24/2016 02:07 PM   Modules accepted: Orders

## 2016-04-24 NOTE — Telephone Encounter (Signed)
Patient requesting an appointment for an infusion. Also she wants a call back to discuss severe pain in every joint in her body.

## 2016-04-25 DIAGNOSIS — R928 Other abnormal and inconclusive findings on diagnostic imaging of breast: Secondary | ICD-10-CM | POA: Diagnosis not present

## 2016-05-05 ENCOUNTER — Telehealth: Payer: Self-pay | Admitting: Neurology

## 2016-05-05 NOTE — Telephone Encounter (Signed)
I called patient. The package insert for Ocrevus does indicate a slight increase for risk of cancers including breast cancer, no change in breast cancer screening protocol is recommended.  I do not think that this risk of cancer would preclude the use of Ocrevus. The patient will call if she has any further questions.

## 2016-05-05 NOTE — Telephone Encounter (Signed)
Patient called office in reference to Stephanie Perez infusion for Wednesday.  Patient states she would like to speak with Dr. Jannifer Franklin in reference to this due to having recent imaging patient read Ocrevus can be a cause of breast cancer and is very concerned.  Please call

## 2016-05-06 DIAGNOSIS — G35 Multiple sclerosis: Secondary | ICD-10-CM | POA: Diagnosis not present

## 2016-05-06 DIAGNOSIS — F331 Major depressive disorder, recurrent, moderate: Secondary | ICD-10-CM | POA: Diagnosis not present

## 2016-05-06 DIAGNOSIS — I10 Essential (primary) hypertension: Secondary | ICD-10-CM | POA: Diagnosis not present

## 2016-05-06 DIAGNOSIS — M545 Low back pain: Secondary | ICD-10-CM | POA: Diagnosis not present

## 2016-05-06 DIAGNOSIS — M05419 Rheumatoid myopathy with rheumatoid arthritis of unspecified shoulder: Secondary | ICD-10-CM | POA: Diagnosis not present

## 2016-05-06 DIAGNOSIS — M1711 Unilateral primary osteoarthritis, right knee: Secondary | ICD-10-CM | POA: Diagnosis not present

## 2016-05-06 DIAGNOSIS — E782 Mixed hyperlipidemia: Secondary | ICD-10-CM | POA: Diagnosis not present

## 2016-05-06 DIAGNOSIS — M810 Age-related osteoporosis without current pathological fracture: Secondary | ICD-10-CM | POA: Diagnosis not present

## 2016-05-07 DIAGNOSIS — G35 Multiple sclerosis: Secondary | ICD-10-CM | POA: Diagnosis not present

## 2016-05-21 ENCOUNTER — Ambulatory Visit: Payer: Medicare Other | Admitting: Neurology

## 2016-05-21 DIAGNOSIS — G35 Multiple sclerosis: Secondary | ICD-10-CM | POA: Diagnosis not present

## 2016-06-09 DIAGNOSIS — M130 Polyarthritis, unspecified: Secondary | ICD-10-CM | POA: Diagnosis not present

## 2016-06-09 DIAGNOSIS — E559 Vitamin D deficiency, unspecified: Secondary | ICD-10-CM | POA: Diagnosis not present

## 2016-06-09 DIAGNOSIS — M5441 Lumbago with sciatica, right side: Secondary | ICD-10-CM | POA: Diagnosis not present

## 2016-06-09 DIAGNOSIS — G8929 Other chronic pain: Secondary | ICD-10-CM | POA: Diagnosis not present

## 2016-06-09 DIAGNOSIS — M25561 Pain in right knee: Secondary | ICD-10-CM | POA: Diagnosis not present

## 2016-06-09 DIAGNOSIS — M25562 Pain in left knee: Secondary | ICD-10-CM | POA: Diagnosis not present

## 2016-06-09 DIAGNOSIS — M79642 Pain in left hand: Secondary | ICD-10-CM | POA: Diagnosis not present

## 2016-06-09 DIAGNOSIS — R5383 Other fatigue: Secondary | ICD-10-CM | POA: Diagnosis not present

## 2016-06-09 DIAGNOSIS — Z79899 Other long term (current) drug therapy: Secondary | ICD-10-CM | POA: Diagnosis not present

## 2016-06-09 DIAGNOSIS — M5442 Lumbago with sciatica, left side: Secondary | ICD-10-CM | POA: Diagnosis not present

## 2016-06-09 DIAGNOSIS — M79641 Pain in right hand: Secondary | ICD-10-CM | POA: Diagnosis not present

## 2016-06-18 DIAGNOSIS — D649 Anemia, unspecified: Secondary | ICD-10-CM | POA: Diagnosis not present

## 2016-06-18 DIAGNOSIS — Z79899 Other long term (current) drug therapy: Secondary | ICD-10-CM | POA: Diagnosis not present

## 2016-06-18 DIAGNOSIS — M8589 Other specified disorders of bone density and structure, multiple sites: Secondary | ICD-10-CM | POA: Diagnosis not present

## 2016-06-18 DIAGNOSIS — E559 Vitamin D deficiency, unspecified: Secondary | ICD-10-CM | POA: Diagnosis not present

## 2016-06-18 DIAGNOSIS — M8580 Other specified disorders of bone density and structure, unspecified site: Secondary | ICD-10-CM | POA: Diagnosis not present

## 2016-06-24 DIAGNOSIS — M130 Polyarthritis, unspecified: Secondary | ICD-10-CM | POA: Diagnosis not present

## 2016-06-24 DIAGNOSIS — M79672 Pain in left foot: Secondary | ICD-10-CM | POA: Diagnosis not present

## 2016-06-24 DIAGNOSIS — G2581 Restless legs syndrome: Secondary | ICD-10-CM | POA: Diagnosis not present

## 2016-06-24 DIAGNOSIS — M79671 Pain in right foot: Secondary | ICD-10-CM | POA: Diagnosis not present

## 2016-06-25 ENCOUNTER — Telehealth: Payer: Self-pay | Admitting: Neurology

## 2016-06-25 NOTE — Telephone Encounter (Signed)
Pt called said she has been prescribed methylprednisone 4mg  #21 (taking 6,5,4,3,2,1) by rhematologist for osteoarthiritis, she is wanting to know if there will be any interaction with ocrevus. Pt said she took 6 pills at 81 today. Please call

## 2016-06-25 NOTE — Telephone Encounter (Signed)
I tried to call the patient, there is no answer. No problem with using steroids with Ocrevus.

## 2016-06-26 NOTE — Telephone Encounter (Signed)
Patient was calling back about wanting to the advise about the medication.. I told her that Dr. Jannifer Franklin tried calling her yesterday and she was sorry that she missed the call.Marland KitchenMarland Kitchen

## 2016-06-26 NOTE — Telephone Encounter (Signed)
Rn call patient back that per Dr Jannifer Franklin she can take meythylprednisone with her ocrevus.. Pt was prescribed the steroids for her arthritis.Pt verbalized understanding.

## 2016-06-30 DIAGNOSIS — W010XXA Fall on same level from slipping, tripping and stumbling without subsequent striking against object, initial encounter: Secondary | ICD-10-CM | POA: Diagnosis not present

## 2016-06-30 DIAGNOSIS — M797 Fibromyalgia: Secondary | ICD-10-CM | POA: Diagnosis not present

## 2016-06-30 DIAGNOSIS — I10 Essential (primary) hypertension: Secondary | ICD-10-CM | POA: Diagnosis not present

## 2016-06-30 DIAGNOSIS — G35 Multiple sclerosis: Secondary | ICD-10-CM | POA: Diagnosis not present

## 2016-06-30 DIAGNOSIS — M25552 Pain in left hip: Secondary | ICD-10-CM | POA: Diagnosis not present

## 2016-06-30 DIAGNOSIS — R51 Headache: Secondary | ICD-10-CM | POA: Diagnosis not present

## 2016-06-30 DIAGNOSIS — Y92009 Unspecified place in unspecified non-institutional (private) residence as the place of occurrence of the external cause: Secondary | ICD-10-CM | POA: Diagnosis not present

## 2016-06-30 DIAGNOSIS — Z886 Allergy status to analgesic agent status: Secondary | ICD-10-CM | POA: Diagnosis not present

## 2016-06-30 DIAGNOSIS — M542 Cervicalgia: Secondary | ICD-10-CM | POA: Diagnosis not present

## 2016-06-30 DIAGNOSIS — Z882 Allergy status to sulfonamides status: Secondary | ICD-10-CM | POA: Diagnosis not present

## 2016-06-30 DIAGNOSIS — M1990 Unspecified osteoarthritis, unspecified site: Secondary | ICD-10-CM | POA: Diagnosis not present

## 2016-07-03 ENCOUNTER — Encounter: Payer: Self-pay | Admitting: Neurology

## 2016-07-03 ENCOUNTER — Ambulatory Visit (INDEPENDENT_AMBULATORY_CARE_PROVIDER_SITE_OTHER): Payer: Medicare Other | Admitting: Neurology

## 2016-07-03 VITALS — BP 137/71 | HR 61 | Ht 66.0 in | Wt 158.0 lb

## 2016-07-03 DIAGNOSIS — N319 Neuromuscular dysfunction of bladder, unspecified: Secondary | ICD-10-CM

## 2016-07-03 DIAGNOSIS — G35 Multiple sclerosis: Secondary | ICD-10-CM | POA: Diagnosis not present

## 2016-07-03 DIAGNOSIS — R269 Unspecified abnormalities of gait and mobility: Secondary | ICD-10-CM | POA: Diagnosis not present

## 2016-07-03 DIAGNOSIS — Z5181 Encounter for therapeutic drug level monitoring: Secondary | ICD-10-CM | POA: Diagnosis not present

## 2016-07-03 MED ORDER — HYDROXYCHLOROQUINE SULFATE 200 MG PO TABS: 400.0000 mg | ORAL_TABLET | Freq: Every day | ORAL | Status: AC

## 2016-07-03 NOTE — Patient Instructions (Signed)
We will check MRI of the brain and cervical spine and get physical therapy.

## 2016-07-03 NOTE — Progress Notes (Signed)
Reason for visit: Multiple sclerosis  Stephanie Perez is an 76 y.o. female  History of present illness:  Ms. Hargan is a 76 year old right-handed white female with a history of multiple sclerosis. The patient has recent started Ocrevus within the last month. The patient has tolerated the medication well. The patient has significant spasticity in the lower extremities, she is on low-dose baclofen but she does not believe that she can tolerate higher doses secondary to sedation and depression. The patient had a fall one week ago, she did go to the emergency room and x-rays were done and a CT scan of the head was done. No fractures were noted. The patient claims that she was walking with a walker and fell backwards. The patient did hit her head. She has had no new symptoms of numbness and weakness of the arms or legs. She has been seen by rheumatologist and was told she had osteopenia. The patient has diffuse arthralgias, she has been placed on Plaquenil. The patient last had MRI evaluation of the brain and cervical spine 3 years ago. She has some neck discomfort. She comes to this office for an evaluation. She has a neurogenic bladder, this has not changed since last seen.  Past Medical History:  Diagnosis Date  . Abnormality of gait   . Anxiety   . Depression   . Fibromyalgia   . Low back pain 10/31/2014  . Lumbago   . Multiple sclerosis ([place])   . Neurogenic bladder   . Rheumatoid arteritis   . Spastic paraparesis     Past Surgical History:  Procedure Laterality Date  . ABDOMINAL HYSTERECTOMY    . APPENDECTOMY    . FRACTURE SURGERY    . GALLBLADDER SURGERY N/A   . VASCULAR SURGERY Left    Lower extremity    Family History  Problem Relation Age of Onset  . Lung cancer Mother   . Heart disease Father   . Other Father   . Multiple sclerosis Brother     Social history:  reports that she has never smoked. She has never used smokeless tobacco. She reports that she does not  drink alcohol or use drugs.    Allergies  Allergen Reactions  . Bactrim [Sulfamethoxazole-Trimethoprim]   . Codeine Sulfate Nausea And Vomiting  . Lyrica [Pregabalin] Other (See Comments)    Chest pain, difficulty swallowing  . Zanaflex [Tizanidine Hcl]   . Sulfa Antibiotics Diarrhea    Medications:  Prior to Admission medications   Medication Sig Start Date End Date Taking? Authorizing Provider  aspirin 81 MG tablet Take 81 mg by mouth daily.   Yes [provider]  baclofen (LIORESAL) 10 MG tablet Take 0.5 tablets (5 mg total) by mouth 2 (two) times daily. Patient taking differently: Take 2.5 mg by mouth 2 (two) times daily.  08/17/15  Yes Kathrynn Ducking, MD  clonazePAM (KLONOPIN) 0.5 MG tablet Take 0.5 mg by mouth at bedtime.    Yes [provider]  estradiol (ESTRACE) 0.5 MG tablet Take 1 tablet by mouth daily. 01/03/15  Yes [provider]  metoprolol tartrate (LOPRESSOR) 25 MG tablet Take 25 mg by mouth daily.  07/14/13  Yes [provider]  mirabegron ER (MYRBETRIQ) 50 MG TB24 tablet Take 50 mg by mouth daily.   Yes [provider]  omeprazole (PRILOSEC) 20 MG capsule Take 1 capsule (20 mg total) by mouth daily. 05/19/12  Yes Kathrynn Ducking, MD  predniSONE (DELTASONE) 5  MG tablet Take 1 tablet (5 mg total) by mouth daily with breakfast. 02/18/16  Yes Kathrynn Ducking, MD  traMADol (ULTRAM) 50 MG tablet Take 50 mg by mouth every 6 (six) hours as needed for pain.   Yes [provider]  valsartan-hydrochlorothiazide (DIOVAN-HCT) 320-25 MG tablet Take 1 tablet by mouth daily. 11/23/14  Yes [provider]  venlafaxine XR (EFFEXOR XR) 150 MG 24 hr capsule Take 1 capsule (150 mg total) by mouth daily with breakfast. 04/24/16  Yes Kathrynn Ducking, MD    ROS:  Out of a complete 14 system review of symptoms, the patient complains only of the following symptoms, and all other reviewed systems are negative.  Restless  legs Incontinence of the bladder Joint pain, joint swelling, back pain, aching muscles, walking difficulty, neck pain, neck stiffness Dizziness Depression, anxiety  Blood pressure 137/71, pulse 61, height 5\' 6"  (1.676 m), weight 158 lb (71.7 kg).  Physical Exam  General: The patient is alert and cooperative at the time of the examination.  Skin: No significant peripheral edema is noted.   Neurologic Exam  Mental status: The patient is alert and oriented x 3 at the time of the examination. The patient has apparent normal recent and remote memory, with an apparently normal attention span and concentration ability.   Cranial nerves: Facial symmetry is present. Speech is normal, no aphasia or dysarthria is noted. Extraocular movements are full. Visual fields are full.  Motor: The patient has good strength in the upper extremities. With the lower extremities, there is increased motor tone, the patient has difficulty with flexion of the knees secondary to spasticity.  Sensory examination: Soft touch sensation is symmetric on the face and arms, decreased on the left leg compared to the right.  Coordination: The patient has good finger-nose-finger bilaterally. The patient is unable to perform heel-to-shin on either side.  Gait and station: The patient requires assistance with standing. Once up, she can take a few steps with help, she has a severe bilateral circumduction type gait. The patient has significant gait instability.  Reflexes: Deep tendon reflexes are symmetric.   Assessment/Plan:  1. Multiple sclerosis  2. Spastic paraparesis  3. Gait disorder  4. Neurogenic bladder  The patient is now on Ocrevus and is tolerating the medication well. Blood work will be done today, MRI of the brain and cervical spine will be ordered with and without gadolinium enhancement. She will follow-up in 6 months. We will order physical therapy for leg strengthening exercises and stretching.    Jill Alexanders MD 07/03/2016 12:04 PM  [facility] [address] [place],  [address]  Phone [phone] Fax [phone]

## 2016-07-04 LAB — COMPREHENSIVE METABOLIC PANEL
A/G RATIO: 2.1 (ref 1.2–2.2)
ALBUMIN: 4.1 g/dL (ref 3.5–4.8)
ALT: 13 IU/L (ref 0–32)
AST: 19 IU/L (ref 0–40)
Alkaline Phosphatase: 58 IU/L (ref 39–117)
BUN / CREAT RATIO: 26 (ref 12–28)
BUN: 18 mg/dL (ref 8–27)
Bilirubin Total: 0.3 mg/dL (ref 0.0–1.2)
CALCIUM: 9.4 mg/dL (ref 8.7–10.3)
CO2: 27 mmol/L (ref 18–29)
Chloride: 99 mmol/L (ref 96–106)
Creatinine, Ser: 0.7 mg/dL (ref 0.57–1.00)
GFR, EST AFRICAN AMERICAN: 97 mL/min/{1.73_m2} (ref >59)
GFR, EST NON AFRICAN AMERICAN: 84 mL/min/{1.73_m2} (ref >59)
GLOBULIN, TOTAL: 2 g/dL (ref 1.5–4.5)
Glucose: 98 mg/dL (ref 65–99)
POTASSIUM: 4.3 mmol/L (ref 3.5–5.2)
Sodium: 140 mmol/L (ref 134–144)
TOTAL PROTEIN: 6.1 g/dL (ref 6.0–8.5)

## 2016-07-04 LAB — CBC WITH DIFFERENTIAL/PLATELET
BASOS: 0 %
Basophils Absolute: 0 10*3/uL (ref 0.0–0.2)
EOS (ABSOLUTE): 0.1 10*3/uL (ref 0.0–0.4)
Eos: 1 %
HEMOGLOBIN: 12.4 g/dL (ref 11.1–15.9)
Hematocrit: 39.1 % (ref 34.0–46.6)
IMMATURE GRANS (ABS): 0 10*3/uL (ref 0.0–0.1)
Immature Granulocytes: 0 %
LYMPHS ABS: 1.2 10*3/uL (ref 0.7–3.1)
LYMPHS: 12 %
MCH: 28.6 pg (ref 26.6–33.0)
MCHC: 31.7 g/dL (ref 31.5–35.7)
MCV: 90 fL (ref 79–97)
Monocytes Absolute: 1 10*3/uL — ABNORMAL HIGH (ref 0.1–0.9)
Monocytes: 10 %
NEUTROS ABS: 7.7 10*3/uL — AB (ref 1.4–7.0)
Neutrophils: 77 %
PLATELETS: 282 10*3/uL (ref 150–379)
RBC: 4.34 x10E6/uL (ref 3.77–5.28)
RDW: 14.8 % (ref 12.3–15.4)
WBC: 10.1 10*3/uL (ref 3.4–10.8)

## 2016-07-08 ENCOUNTER — Telehealth: Payer: Self-pay | Admitting: *Deleted

## 2016-07-08 NOTE — Telephone Encounter (Signed)
Tried calling pt. Rang several times, asked for remote access code. Unable to LVM.   *Okay to inform patient lab results unremarkable per CW,MD if she calls

## 2016-07-08 NOTE — Telephone Encounter (Signed)
-----   Message from Kathrynn Ducking, MD sent at 07/05/2016  7:29 AM EDT -----  The blood work results are unremarkable. Please call the patient.  ----- Message ----- From: Lavone Neri Lab Results In Sent: 07/04/2016   5:41 AM To: Kathrynn Ducking, MD

## 2016-07-09 ENCOUNTER — Telehealth: Payer: Self-pay | Admitting: Neurology

## 2016-07-09 NOTE — Telephone Encounter (Signed)
Called and spoke with patient about unremarkable labs per CW,MD. Pt verbalized understanding.

## 2016-07-09 NOTE — Telephone Encounter (Signed)
I called the patient and left a VM asking her to call me back regarding the referral and the MRI.

## 2016-07-09 NOTE — Telephone Encounter (Signed)
Pt is asking to be called back re: her physical therapy.  Pt is wanting to go to a [facility] facility, she is willing to go to any [facility] facility in the area. Pt also has questions re: when her MRI will be scheduled.

## 2016-07-14 NOTE — Telephone Encounter (Signed)
I spoke with the patient who was inquiring about what test Dr. Jannifer Franklin has ordered for her brain. I explained that it was an MRI Brain and MRI Cervical. She wanted to know why we sent it to [facility] and not our office and I explained Medicare would not pay for it in our office and [facility] was closer to her home.

## 2016-07-21 ENCOUNTER — Telehealth: Payer: Self-pay | Admitting: Neurology

## 2016-07-21 ENCOUNTER — Ambulatory Visit (HOSPITAL_COMMUNITY)
Admission: RE | Admit: 2016-07-21 | Discharge: 2016-07-21 | Disposition: A | Discharge status: Home / Self Care | Payer: Medicare Other | Source: Ambulatory Visit | Attending: Neurology | Admitting: Neurology

## 2016-07-21 DIAGNOSIS — G129 Spinal muscular atrophy, unspecified: Secondary | ICD-10-CM | POA: Diagnosis not present

## 2016-07-21 DIAGNOSIS — M47812 Spondylosis without myelopathy or radiculopathy, cervical region: Secondary | ICD-10-CM | POA: Diagnosis not present

## 2016-07-21 DIAGNOSIS — G35 Multiple sclerosis: Secondary | ICD-10-CM | POA: Insufficient documentation

## 2016-07-21 DIAGNOSIS — R269 Unspecified abnormalities of gait and mobility: Secondary | ICD-10-CM | POA: Diagnosis present

## 2016-07-21 DIAGNOSIS — M50322 Other cervical disc degeneration at C5-C6 level: Secondary | ICD-10-CM | POA: Diagnosis not present

## 2016-07-21 DIAGNOSIS — S0990XA Unspecified injury of head, initial encounter: Secondary | ICD-10-CM | POA: Diagnosis not present

## 2016-07-21 MED ORDER — GADOBENATE DIMEGLUMINE 529 MG/ML IV SOLN
15.0000 mL | Freq: Once | INTRAVENOUS | Status: AC | PRN
  Administered 2016-07-21: 14 mL via INTRAVENOUS

## 2016-07-21 NOTE — Telephone Encounter (Signed)
  I called the patient. I talk with her husband. MRI the brain shows white matter changes in the brain and brainstem and in the spinal cord at the C5-6 level consistent with multiple sclerosis. No enhancement is noted, changes appear to be chronic.  MRI cervical and brain 07/21/16:  IMPRESSION: Extensive white matter disease throughout the cerebral hemispheres, also affecting the brainstem. Findings are consistent with chronic multiple sclerosis, although small vessel disease may be superimposed. No evidence for acute plaque activity.  Mildly atrophic cord at C5 and C6. Peripheral white matter signal abnormality consistent with spinal involvement from multiple sclerosis. No abnormal postcontrast enhancement or cord swelling to suggest acuity.  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  No posttraumatic sequelae.

## 2016-07-22 NOTE — Telephone Encounter (Signed)
Pt's called back to speak with Dr Jannifer Franklin. Please call at your convenience

## 2016-07-22 NOTE — Telephone Encounter (Signed)
I called the patient. I went over the report of the MRI study with her again.  The patient claims that while in the MRI suite she fell and has hurt her left elbow and left hip.  If this continues further investigation of this may be required.

## 2016-07-29 DIAGNOSIS — R2689 Other abnormalities of gait and mobility: Secondary | ICD-10-CM | POA: Diagnosis not present

## 2016-08-01 DIAGNOSIS — R2689 Other abnormalities of gait and mobility: Secondary | ICD-10-CM | POA: Diagnosis not present

## 2016-08-05 DIAGNOSIS — G35 Multiple sclerosis: Secondary | ICD-10-CM | POA: Diagnosis not present

## 2016-08-05 DIAGNOSIS — M797 Fibromyalgia: Secondary | ICD-10-CM | POA: Diagnosis not present

## 2016-08-05 DIAGNOSIS — M05419 Rheumatoid myopathy with rheumatoid arthritis of unspecified shoulder: Secondary | ICD-10-CM | POA: Diagnosis not present

## 2016-08-05 DIAGNOSIS — E782 Mixed hyperlipidemia: Secondary | ICD-10-CM | POA: Diagnosis not present

## 2016-08-05 DIAGNOSIS — M810 Age-related osteoporosis without current pathological fracture: Secondary | ICD-10-CM | POA: Diagnosis not present

## 2016-08-05 DIAGNOSIS — I10 Essential (primary) hypertension: Secondary | ICD-10-CM | POA: Diagnosis not present

## 2016-08-05 DIAGNOSIS — M545 Low back pain: Secondary | ICD-10-CM | POA: Diagnosis not present

## 2016-08-05 DIAGNOSIS — Z6827 Body mass index (BMI) 27.0-27.9, adult: Secondary | ICD-10-CM | POA: Diagnosis not present

## 2016-08-06 DIAGNOSIS — R2689 Other abnormalities of gait and mobility: Secondary | ICD-10-CM | POA: Diagnosis not present

## 2016-08-08 DIAGNOSIS — R2689 Other abnormalities of gait and mobility: Secondary | ICD-10-CM | POA: Diagnosis not present

## 2016-08-11 DIAGNOSIS — S93422A Sprain of deltoid ligament of left ankle, initial encounter: Secondary | ICD-10-CM | POA: Diagnosis not present

## 2016-08-11 DIAGNOSIS — M2550 Pain in unspecified joint: Secondary | ICD-10-CM | POA: Diagnosis not present

## 2016-08-11 DIAGNOSIS — R296 Repeated falls: Secondary | ICD-10-CM | POA: Diagnosis not present

## 2016-08-11 DIAGNOSIS — Z6826 Body mass index (BMI) 26.0-26.9, adult: Secondary | ICD-10-CM | POA: Diagnosis not present

## 2016-08-14 ENCOUNTER — Telehealth: Payer: Self-pay | Admitting: Neurology

## 2016-08-14 DIAGNOSIS — F329 Major depressive disorder, single episode, unspecified: Secondary | ICD-10-CM

## 2016-08-14 DIAGNOSIS — M545 Low back pain: Secondary | ICD-10-CM | POA: Diagnosis not present

## 2016-08-14 DIAGNOSIS — M1612 Unilateral primary osteoarthritis, left hip: Secondary | ICD-10-CM | POA: Diagnosis not present

## 2016-08-14 DIAGNOSIS — F32A Depression, unspecified: Secondary | ICD-10-CM

## 2016-08-14 NOTE — Addendum Note (Signed)
Addended by: Trudie Buckler on: 08/14/2016 02:42 PM   Modules accepted: Orders

## 2016-08-14 NOTE — Telephone Encounter (Signed)
Patient calling stating her MS is getting worse. She has fallen 4 times in the last 2 weeks and went to an urgent care but had no broken bones. She cannot step on left leg because of pain and she is very depressed. I advised Dr. Jannifer Franklin is out of the office but I will send to [facility] nurse since she has seen her in the past and has appointment with her in November. Please call and discuss.

## 2016-08-14 NOTE — Telephone Encounter (Signed)
I called the patient. She reports that her symptoms have not changed much since she saw Dr. Jannifer Franklin. Her only change is that she's had several falls and she thinks that she has injured the left leg. She states that she is unable to bear weight on that leg. She reports that she has had x-rays  But they did not show a fracture. She has called her primary care Dr. Olena Perez who plans to look at the x-rays and potentially order further testing if needed. The patient states that she is suffering from depression. She no longer feels that Stephanie Perez is working well for her. She is very tearful on the phone. She states that there are sometimes that she "does not want to live." I asked the patient if she was thinking about harming herself. She states that "she would never do that because of her 3 grandchildren." I advised the patient that she should go to the emergency room if she's having thoughts of harming herself. The patient states that she is not going to harm herself. She does state that she believes having a psychiatrist or counselor would be beneficial.. I advised patient I would make a referral. I again reiterated to the patient that if she begins to have thoughts of harming herselfshe should call 911. She voiced understanding. At this time it sounds as a Dr. Olena Perez is managing left leg pain. Advised patient she should call us if no improvement in her symptoms.

## 2016-08-20 DIAGNOSIS — M545 Low back pain: Secondary | ICD-10-CM | POA: Diagnosis not present

## 2016-08-20 DIAGNOSIS — M48061 Spinal stenosis, lumbar region without neurogenic claudication: Secondary | ICD-10-CM | POA: Diagnosis not present

## 2016-09-05 DIAGNOSIS — R2689 Other abnormalities of gait and mobility: Secondary | ICD-10-CM | POA: Diagnosis not present

## 2016-09-12 DIAGNOSIS — M1612 Unilateral primary osteoarthritis, left hip: Secondary | ICD-10-CM | POA: Diagnosis not present

## 2016-09-12 DIAGNOSIS — M5116 Intervertebral disc disorders with radiculopathy, lumbar region: Secondary | ICD-10-CM | POA: Diagnosis not present

## 2016-09-12 DIAGNOSIS — M5416 Radiculopathy, lumbar region: Secondary | ICD-10-CM | POA: Diagnosis not present

## 2016-09-12 DIAGNOSIS — M545 Low back pain: Secondary | ICD-10-CM | POA: Diagnosis not present

## 2016-09-30 ENCOUNTER — Telehealth: Payer: Self-pay | Admitting: Neurology

## 2016-09-30 DIAGNOSIS — I10 Essential (primary) hypertension: Secondary | ICD-10-CM | POA: Diagnosis not present

## 2016-09-30 DIAGNOSIS — G35 Multiple sclerosis: Secondary | ICD-10-CM | POA: Diagnosis not present

## 2016-09-30 DIAGNOSIS — R0781 Pleurodynia: Secondary | ICD-10-CM | POA: Diagnosis not present

## 2016-09-30 DIAGNOSIS — Z6825 Body mass index (BMI) 25.0-25.9, adult: Secondary | ICD-10-CM | POA: Diagnosis not present

## 2016-09-30 DIAGNOSIS — S50312A Abrasion of left elbow, initial encounter: Secondary | ICD-10-CM | POA: Diagnosis not present

## 2016-09-30 MED ORDER — PREDNISONE 5 MG PO TABS: ORAL_TABLET | ORAL | 0 refills | Status: DC

## 2016-09-30 NOTE — Telephone Encounter (Signed)
I called the patient. 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 The patient has been having increased falls, this is likely mainly related to the multiple sclerosis.  The patient will be seeing Dr. Carloyn Manner. Her fall frequency has increased recently, I will give a short course of prednisone.

## 2016-09-30 NOTE — Addendum Note (Signed)
Addended by: Kathrynn Ducking on: 09/30/2016 01:17 PM   Modules accepted: Orders

## 2016-09-30 NOTE — Telephone Encounter (Signed)
Patient is calling. She has been falling quite a bit. Her PCP ordered an MRI and it showed spinal stenosis and will be referred to a surgeon. She is in a lot of pain and takes Tramadol and Tylenol. She wants to know if a steroid can be called to [facility] on [address] in [place], [place]. Please call to discuss.

## 2016-10-01 DIAGNOSIS — E559 Vitamin D deficiency, unspecified: Secondary | ICD-10-CM | POA: Diagnosis not present

## 2016-10-01 DIAGNOSIS — M25561 Pain in right knee: Secondary | ICD-10-CM | POA: Diagnosis not present

## 2016-10-01 DIAGNOSIS — M8580 Other specified disorders of bone density and structure, unspecified site: Secondary | ICD-10-CM | POA: Diagnosis not present

## 2016-10-01 DIAGNOSIS — M130 Polyarthritis, unspecified: Secondary | ICD-10-CM | POA: Diagnosis not present

## 2016-10-01 DIAGNOSIS — G35 Multiple sclerosis: Secondary | ICD-10-CM | POA: Diagnosis not present

## 2016-10-01 DIAGNOSIS — Z79899 Other long term (current) drug therapy: Secondary | ICD-10-CM | POA: Diagnosis not present

## 2016-10-20 DIAGNOSIS — R300 Dysuria: Secondary | ICD-10-CM | POA: Diagnosis not present

## 2016-10-27 DIAGNOSIS — M4716 Other spondylosis with myelopathy, lumbar region: Secondary | ICD-10-CM | POA: Diagnosis not present

## 2016-10-27 DIAGNOSIS — M48061 Spinal stenosis, lumbar region without neurogenic claudication: Secondary | ICD-10-CM | POA: Diagnosis not present

## 2016-10-27 DIAGNOSIS — M4316 Spondylolisthesis, lumbar region: Secondary | ICD-10-CM | POA: Diagnosis not present

## 2016-11-01 DIAGNOSIS — I10 Essential (primary) hypertension: Secondary | ICD-10-CM | POA: Diagnosis not present

## 2016-11-01 DIAGNOSIS — M797 Fibromyalgia: Secondary | ICD-10-CM | POA: Diagnosis not present

## 2016-11-01 DIAGNOSIS — E782 Mixed hyperlipidemia: Secondary | ICD-10-CM | POA: Diagnosis not present

## 2016-11-03 DIAGNOSIS — G35 Multiple sclerosis: Secondary | ICD-10-CM | POA: Diagnosis not present

## 2016-11-04 DIAGNOSIS — G35 Multiple sclerosis: Secondary | ICD-10-CM | POA: Diagnosis not present

## 2016-11-05 DIAGNOSIS — Z6826 Body mass index (BMI) 26.0-26.9, adult: Secondary | ICD-10-CM | POA: Diagnosis not present

## 2016-11-05 DIAGNOSIS — G35 Multiple sclerosis: Secondary | ICD-10-CM | POA: Diagnosis not present

## 2016-11-05 DIAGNOSIS — I10 Essential (primary) hypertension: Secondary | ICD-10-CM | POA: Diagnosis not present

## 2016-11-05 DIAGNOSIS — Z23 Encounter for immunization: Secondary | ICD-10-CM | POA: Diagnosis not present

## 2016-11-05 DIAGNOSIS — M05419 Rheumatoid myopathy with rheumatoid arthritis of unspecified shoulder: Secondary | ICD-10-CM | POA: Diagnosis not present

## 2016-11-05 DIAGNOSIS — E782 Mixed hyperlipidemia: Secondary | ICD-10-CM | POA: Diagnosis not present

## 2016-11-05 DIAGNOSIS — M810 Age-related osteoporosis without current pathological fracture: Secondary | ICD-10-CM | POA: Diagnosis not present

## 2016-11-05 DIAGNOSIS — M545 Low back pain: Secondary | ICD-10-CM | POA: Diagnosis not present

## 2016-11-10 DIAGNOSIS — R928 Other abnormal and inconclusive findings on diagnostic imaging of breast: Secondary | ICD-10-CM | POA: Diagnosis not present

## 2016-11-11 ENCOUNTER — Telehealth: Payer: Self-pay | Admitting: Neurology

## 2016-11-11 NOTE — Telephone Encounter (Signed)
I called patient. The patient had an infusion of Ocrevus last week and she is still feeling somewhat fatigued and weak from this. If this does not improve over the next week, the patient will need to come in for blood work. She will call me concerning this.  The patient denies any cough or fever.

## 2016-11-11 NOTE — Telephone Encounter (Signed)
Patient called in stating she had her last infusion on 11/03/16. Ever since she had the infusion she's been feeling extremely weak and light headed. She has been using her electric chair to get her around because she is having a hard time walking around and using her walker. She stated she hasn't change any of her medicine or anything. She also stated she feels like she has no strength and hasn't felt like eating.

## 2016-11-14 NOTE — Telephone Encounter (Signed)
ERROR

## 2016-11-17 ENCOUNTER — Telehealth: Payer: Self-pay | Admitting: Neurology

## 2016-11-17 DIAGNOSIS — Z5181 Encounter for therapeutic drug level monitoring: Secondary | ICD-10-CM

## 2016-11-17 NOTE — Telephone Encounter (Signed)
Pt states the last time she spoke with Dr Jannifer Franklin she made him aware of how she was feeling.  Pt states Dr Jannifer Franklin told her to call back if she's was not feeling any better within a week.  Pt calling to inform that she is not feeling any better and has less strength.  Please call

## 2016-11-17 NOTE — Addendum Note (Signed)
Addended by: Kathrynn Ducking on: 11/17/2016 05:47 PM   Modules accepted: Orders

## 2016-11-17 NOTE — Telephone Encounter (Signed)
I called the patient. I will have her come in for blood work and urinalysis, she is still feeling poorly following the Ocrevus dosing.

## 2016-11-28 DIAGNOSIS — B349 Viral infection, unspecified: Secondary | ICD-10-CM | POA: Diagnosis not present

## 2016-12-10 DIAGNOSIS — M1990 Unspecified osteoarthritis, unspecified site: Secondary | ICD-10-CM | POA: Diagnosis not present

## 2016-12-10 DIAGNOSIS — I10 Essential (primary) hypertension: Secondary | ICD-10-CM | POA: Diagnosis not present

## 2016-12-10 DIAGNOSIS — K219 Gastro-esophageal reflux disease without esophagitis: Secondary | ICD-10-CM | POA: Diagnosis not present

## 2016-12-10 DIAGNOSIS — J9811 Atelectasis: Secondary | ICD-10-CM | POA: Diagnosis not present

## 2016-12-10 DIAGNOSIS — G35 Multiple sclerosis: Secondary | ICD-10-CM | POA: Diagnosis not present

## 2016-12-10 DIAGNOSIS — Z79899 Other long term (current) drug therapy: Secondary | ICD-10-CM | POA: Diagnosis not present

## 2016-12-10 DIAGNOSIS — M4716 Other spondylosis with myelopathy, lumbar region: Secondary | ICD-10-CM | POA: Diagnosis not present

## 2016-12-10 DIAGNOSIS — Z7982 Long term (current) use of aspirin: Secondary | ICD-10-CM | POA: Diagnosis not present

## 2016-12-10 DIAGNOSIS — Z01818 Encounter for other preprocedural examination: Secondary | ICD-10-CM | POA: Diagnosis not present

## 2016-12-15 DIAGNOSIS — Z79899 Other long term (current) drug therapy: Secondary | ICD-10-CM | POA: Diagnosis not present

## 2016-12-15 DIAGNOSIS — G2581 Restless legs syndrome: Secondary | ICD-10-CM | POA: Diagnosis present

## 2016-12-15 DIAGNOSIS — I10 Essential (primary) hypertension: Secondary | ICD-10-CM | POA: Diagnosis present

## 2016-12-15 DIAGNOSIS — Z8249 Family history of ischemic heart disease and other diseases of the circulatory system: Secondary | ICD-10-CM | POA: Diagnosis not present

## 2016-12-15 DIAGNOSIS — Z888 Allergy status to other drugs, medicaments and biological substances status: Secondary | ICD-10-CM | POA: Diagnosis not present

## 2016-12-15 DIAGNOSIS — M4716 Other spondylosis with myelopathy, lumbar region: Secondary | ICD-10-CM | POA: Diagnosis present

## 2016-12-15 DIAGNOSIS — Z882 Allergy status to sulfonamides status: Secondary | ICD-10-CM | POA: Diagnosis not present

## 2016-12-15 DIAGNOSIS — M069 Rheumatoid arthritis, unspecified: Secondary | ICD-10-CM | POA: Diagnosis present

## 2016-12-15 DIAGNOSIS — Z7982 Long term (current) use of aspirin: Secondary | ICD-10-CM | POA: Diagnosis not present

## 2016-12-15 DIAGNOSIS — F339 Major depressive disorder, recurrent, unspecified: Secondary | ICD-10-CM | POA: Diagnosis present

## 2016-12-15 DIAGNOSIS — M4316 Spondylolisthesis, lumbar region: Secondary | ICD-10-CM | POA: Diagnosis present

## 2016-12-15 DIAGNOSIS — M6281 Muscle weakness (generalized): Secondary | ICD-10-CM | POA: Diagnosis not present

## 2016-12-15 DIAGNOSIS — F0390 Unspecified dementia without behavioral disturbance: Secondary | ICD-10-CM | POA: Diagnosis present

## 2016-12-15 DIAGNOSIS — Z886 Allergy status to analgesic agent status: Secondary | ICD-10-CM | POA: Diagnosis not present

## 2016-12-15 DIAGNOSIS — M4326 Fusion of spine, lumbar region: Secondary | ICD-10-CM | POA: Diagnosis not present

## 2016-12-15 DIAGNOSIS — M810 Age-related osteoporosis without current pathological fracture: Secondary | ICD-10-CM | POA: Diagnosis present

## 2016-12-15 DIAGNOSIS — Z836 Family history of other diseases of the respiratory system: Secondary | ICD-10-CM | POA: Diagnosis not present

## 2016-12-15 DIAGNOSIS — G35 Multiple sclerosis: Secondary | ICD-10-CM | POA: Diagnosis present

## 2016-12-15 DIAGNOSIS — Z881 Allergy status to other antibiotic agents status: Secondary | ICD-10-CM | POA: Diagnosis not present

## 2016-12-15 DIAGNOSIS — K5900 Constipation, unspecified: Secondary | ICD-10-CM | POA: Diagnosis present

## 2016-12-15 DIAGNOSIS — Z981 Arthrodesis status: Secondary | ICD-10-CM | POA: Diagnosis not present

## 2016-12-15 DIAGNOSIS — F329 Major depressive disorder, single episode, unspecified: Secondary | ICD-10-CM | POA: Diagnosis not present

## 2016-12-15 DIAGNOSIS — Z4789 Encounter for other orthopedic aftercare: Secondary | ICD-10-CM | POA: Diagnosis not present

## 2016-12-15 DIAGNOSIS — K219 Gastro-esophageal reflux disease without esophagitis: Secondary | ICD-10-CM | POA: Diagnosis not present

## 2016-12-15 DIAGNOSIS — M47816 Spondylosis without myelopathy or radiculopathy, lumbar region: Secondary | ICD-10-CM | POA: Diagnosis not present

## 2016-12-19 DIAGNOSIS — Z888 Allergy status to other drugs, medicaments and biological substances status: Secondary | ICD-10-CM | POA: Diagnosis not present

## 2016-12-19 DIAGNOSIS — Z882 Allergy status to sulfonamides status: Secondary | ICD-10-CM | POA: Diagnosis not present

## 2016-12-19 DIAGNOSIS — F329 Major depressive disorder, single episode, unspecified: Secondary | ICD-10-CM | POA: Diagnosis not present

## 2016-12-19 DIAGNOSIS — I10 Essential (primary) hypertension: Secondary | ICD-10-CM | POA: Diagnosis not present

## 2016-12-19 DIAGNOSIS — M810 Age-related osteoporosis without current pathological fracture: Secondary | ICD-10-CM | POA: Diagnosis not present

## 2016-12-19 DIAGNOSIS — M069 Rheumatoid arthritis, unspecified: Secondary | ICD-10-CM | POA: Diagnosis not present

## 2016-12-19 DIAGNOSIS — M79605 Pain in left leg: Secondary | ICD-10-CM | POA: Diagnosis not present

## 2016-12-19 DIAGNOSIS — G35 Multiple sclerosis: Secondary | ICD-10-CM | POA: Diagnosis not present

## 2016-12-19 DIAGNOSIS — Z886 Allergy status to analgesic agent status: Secondary | ICD-10-CM | POA: Diagnosis not present

## 2016-12-19 DIAGNOSIS — M6281 Muscle weakness (generalized): Secondary | ICD-10-CM | POA: Diagnosis not present

## 2016-12-19 DIAGNOSIS — M4716 Other spondylosis with myelopathy, lumbar region: Secondary | ICD-10-CM | POA: Diagnosis not present

## 2016-12-19 DIAGNOSIS — M545 Low back pain: Secondary | ICD-10-CM | POA: Diagnosis not present

## 2016-12-19 DIAGNOSIS — Z981 Arthrodesis status: Secondary | ICD-10-CM | POA: Diagnosis not present

## 2016-12-19 DIAGNOSIS — Z4789 Encounter for other orthopedic aftercare: Secondary | ICD-10-CM | POA: Diagnosis not present

## 2016-12-19 DIAGNOSIS — Z881 Allergy status to other antibiotic agents status: Secondary | ICD-10-CM | POA: Diagnosis not present

## 2016-12-19 DIAGNOSIS — G2581 Restless legs syndrome: Secondary | ICD-10-CM | POA: Diagnosis not present

## 2016-12-19 DIAGNOSIS — K219 Gastro-esophageal reflux disease without esophagitis: Secondary | ICD-10-CM | POA: Diagnosis not present

## 2016-12-20 DIAGNOSIS — M4716 Other spondylosis with myelopathy, lumbar region: Secondary | ICD-10-CM | POA: Diagnosis not present

## 2016-12-20 DIAGNOSIS — G35 Multiple sclerosis: Secondary | ICD-10-CM | POA: Diagnosis not present

## 2016-12-24 DIAGNOSIS — Z981 Arthrodesis status: Secondary | ICD-10-CM | POA: Diagnosis not present

## 2016-12-24 DIAGNOSIS — M79605 Pain in left leg: Secondary | ICD-10-CM | POA: Diagnosis not present

## 2016-12-24 DIAGNOSIS — M545 Low back pain: Secondary | ICD-10-CM | POA: Diagnosis not present

## 2017-01-03 DIAGNOSIS — M4716 Other spondylosis with myelopathy, lumbar region: Secondary | ICD-10-CM | POA: Diagnosis not present

## 2017-01-03 DIAGNOSIS — G35 Multiple sclerosis: Secondary | ICD-10-CM | POA: Diagnosis not present

## 2017-01-05 ENCOUNTER — Ambulatory Visit: Payer: Medicare Other | Admitting: Adult Health

## 2017-01-06 ENCOUNTER — Telehealth: Payer: Self-pay | Admitting: Neurology

## 2017-01-06 NOTE — Telephone Encounter (Signed)
I called the patient.  I talked with husband.  The patient has had back surgery, she is now in rehab for this.  She wants to get a power wheelchair for mobility.  I indicated that she will need a mobility assessment evaluation and a note documenting this, I suppose this could be done through physical therapy at the extended care facility if they are willing to do it.  Once done, I can write a prescription for the equipment requested.

## 2017-01-06 NOTE — Telephone Encounter (Signed)
Patient called in stating that she would like a order put in for a motorized chair to get around at her house because sometimes she has trouble walking with her walker. She stated the back surgery helped her pain but didn't help her with her walking. Right now she is in rehab for 10 days for her to be able to learn to balance and walk with her walker. She states she would like her chair to come from [facility] the lady's name there is Buffy and her fax number is [phone].

## 2017-01-21 DIAGNOSIS — M4306 Spondylolysis, lumbar region: Secondary | ICD-10-CM | POA: Diagnosis not present

## 2017-01-21 DIAGNOSIS — M069 Rheumatoid arthritis, unspecified: Secondary | ICD-10-CM | POA: Diagnosis not present

## 2017-01-21 DIAGNOSIS — Z4789 Encounter for other orthopedic aftercare: Secondary | ICD-10-CM | POA: Diagnosis not present

## 2017-01-21 DIAGNOSIS — M47816 Spondylosis without myelopathy or radiculopathy, lumbar region: Secondary | ICD-10-CM | POA: Diagnosis not present

## 2017-01-21 DIAGNOSIS — F0390 Unspecified dementia without behavioral disturbance: Secondary | ICD-10-CM | POA: Diagnosis not present

## 2017-01-21 DIAGNOSIS — M810 Age-related osteoporosis without current pathological fracture: Secondary | ICD-10-CM | POA: Diagnosis not present

## 2017-01-22 DIAGNOSIS — M4807 Spinal stenosis, lumbosacral region: Secondary | ICD-10-CM | POA: Diagnosis not present

## 2017-01-22 DIAGNOSIS — R300 Dysuria: Secondary | ICD-10-CM | POA: Diagnosis not present

## 2017-01-22 DIAGNOSIS — E559 Vitamin D deficiency, unspecified: Secondary | ICD-10-CM | POA: Diagnosis not present

## 2017-01-23 DIAGNOSIS — Z4789 Encounter for other orthopedic aftercare: Secondary | ICD-10-CM | POA: Diagnosis not present

## 2017-01-23 DIAGNOSIS — M47816 Spondylosis without myelopathy or radiculopathy, lumbar region: Secondary | ICD-10-CM | POA: Diagnosis not present

## 2017-01-23 DIAGNOSIS — M810 Age-related osteoporosis without current pathological fracture: Secondary | ICD-10-CM | POA: Diagnosis not present

## 2017-01-23 DIAGNOSIS — F0390 Unspecified dementia without behavioral disturbance: Secondary | ICD-10-CM | POA: Diagnosis not present

## 2017-01-23 DIAGNOSIS — M4306 Spondylolysis, lumbar region: Secondary | ICD-10-CM | POA: Diagnosis not present

## 2017-01-23 DIAGNOSIS — M069 Rheumatoid arthritis, unspecified: Secondary | ICD-10-CM | POA: Diagnosis not present

## 2017-01-26 DIAGNOSIS — M810 Age-related osteoporosis without current pathological fracture: Secondary | ICD-10-CM | POA: Diagnosis not present

## 2017-01-26 DIAGNOSIS — M069 Rheumatoid arthritis, unspecified: Secondary | ICD-10-CM | POA: Diagnosis not present

## 2017-01-26 DIAGNOSIS — M47816 Spondylosis without myelopathy or radiculopathy, lumbar region: Secondary | ICD-10-CM | POA: Diagnosis not present

## 2017-01-26 DIAGNOSIS — F0390 Unspecified dementia without behavioral disturbance: Secondary | ICD-10-CM | POA: Diagnosis not present

## 2017-01-26 DIAGNOSIS — M4306 Spondylolysis, lumbar region: Secondary | ICD-10-CM | POA: Diagnosis not present

## 2017-01-26 DIAGNOSIS — Z4789 Encounter for other orthopedic aftercare: Secondary | ICD-10-CM | POA: Diagnosis not present

## 2017-01-28 DIAGNOSIS — M810 Age-related osteoporosis without current pathological fracture: Secondary | ICD-10-CM | POA: Diagnosis not present

## 2017-01-28 DIAGNOSIS — Z4789 Encounter for other orthopedic aftercare: Secondary | ICD-10-CM | POA: Diagnosis not present

## 2017-01-28 DIAGNOSIS — M4306 Spondylolysis, lumbar region: Secondary | ICD-10-CM | POA: Diagnosis not present

## 2017-01-28 DIAGNOSIS — F0390 Unspecified dementia without behavioral disturbance: Secondary | ICD-10-CM | POA: Diagnosis not present

## 2017-01-28 DIAGNOSIS — M47816 Spondylosis without myelopathy or radiculopathy, lumbar region: Secondary | ICD-10-CM | POA: Diagnosis not present

## 2017-01-28 DIAGNOSIS — M069 Rheumatoid arthritis, unspecified: Secondary | ICD-10-CM | POA: Diagnosis not present

## 2017-01-30 DIAGNOSIS — Z6825 Body mass index (BMI) 25.0-25.9, adult: Secondary | ICD-10-CM | POA: Diagnosis not present

## 2017-01-30 DIAGNOSIS — M069 Rheumatoid arthritis, unspecified: Secondary | ICD-10-CM | POA: Diagnosis not present

## 2017-01-30 DIAGNOSIS — I700 Atherosclerosis of aorta: Secondary | ICD-10-CM | POA: Diagnosis not present

## 2017-01-30 DIAGNOSIS — M47816 Spondylosis without myelopathy or radiculopathy, lumbar region: Secondary | ICD-10-CM | POA: Diagnosis not present

## 2017-01-30 DIAGNOSIS — M4306 Spondylolysis, lumbar region: Secondary | ICD-10-CM | POA: Diagnosis not present

## 2017-01-30 DIAGNOSIS — Z4789 Encounter for other orthopedic aftercare: Secondary | ICD-10-CM | POA: Diagnosis not present

## 2017-01-30 DIAGNOSIS — F0390 Unspecified dementia without behavioral disturbance: Secondary | ICD-10-CM | POA: Diagnosis not present

## 2017-01-30 DIAGNOSIS — M48061 Spinal stenosis, lumbar region without neurogenic claudication: Secondary | ICD-10-CM | POA: Diagnosis not present

## 2017-01-30 DIAGNOSIS — Z981 Arthrodesis status: Secondary | ICD-10-CM | POA: Diagnosis not present

## 2017-01-30 DIAGNOSIS — M4316 Spondylolisthesis, lumbar region: Secondary | ICD-10-CM | POA: Diagnosis not present

## 2017-01-30 DIAGNOSIS — M4716 Other spondylosis with myelopathy, lumbar region: Secondary | ICD-10-CM | POA: Diagnosis not present

## 2017-01-30 DIAGNOSIS — M810 Age-related osteoporosis without current pathological fracture: Secondary | ICD-10-CM | POA: Diagnosis not present

## 2017-01-30 DIAGNOSIS — M4186 Other forms of scoliosis, lumbar region: Secondary | ICD-10-CM | POA: Diagnosis not present

## 2017-02-04 ENCOUNTER — Telehealth: Payer: Self-pay | Admitting: Neurology

## 2017-02-04 DIAGNOSIS — F0390 Unspecified dementia without behavioral disturbance: Secondary | ICD-10-CM | POA: Diagnosis not present

## 2017-02-04 DIAGNOSIS — Z4789 Encounter for other orthopedic aftercare: Secondary | ICD-10-CM | POA: Diagnosis not present

## 2017-02-04 DIAGNOSIS — M069 Rheumatoid arthritis, unspecified: Secondary | ICD-10-CM | POA: Diagnosis not present

## 2017-02-04 DIAGNOSIS — M4306 Spondylolysis, lumbar region: Secondary | ICD-10-CM | POA: Diagnosis not present

## 2017-02-04 DIAGNOSIS — M47816 Spondylosis without myelopathy or radiculopathy, lumbar region: Secondary | ICD-10-CM | POA: Diagnosis not present

## 2017-02-04 DIAGNOSIS — M810 Age-related osteoporosis without current pathological fracture: Secondary | ICD-10-CM | POA: Diagnosis not present

## 2017-02-04 NOTE — Telephone Encounter (Signed)
Pt calling she had lower spine surgery 2 mths ago and then went to rehab. She is now home and using a walker when her husband is home otherwise she uses a scooter. Pt said she has high anxiety that comes and goes and is wanting to know if Dr Jannifer Franklin would send in script for diazepam sent to [facility]. Pt is requesting to speak with Dr Jannifer Franklin

## 2017-02-04 NOTE — Telephone Encounter (Signed)
I called the patient.  The patient has a prescription for clonazepam, she is to use this for anxiety, I will see her at some point in January as a revisit.

## 2017-02-06 ENCOUNTER — Other Ambulatory Visit: Payer: Self-pay | Admitting: Neurology

## 2017-02-06 DIAGNOSIS — F0390 Unspecified dementia without behavioral disturbance: Secondary | ICD-10-CM | POA: Diagnosis not present

## 2017-02-06 DIAGNOSIS — M069 Rheumatoid arthritis, unspecified: Secondary | ICD-10-CM | POA: Diagnosis not present

## 2017-02-06 DIAGNOSIS — M4306 Spondylolysis, lumbar region: Secondary | ICD-10-CM | POA: Diagnosis not present

## 2017-02-06 DIAGNOSIS — Z4789 Encounter for other orthopedic aftercare: Secondary | ICD-10-CM | POA: Diagnosis not present

## 2017-02-06 DIAGNOSIS — M810 Age-related osteoporosis without current pathological fracture: Secondary | ICD-10-CM | POA: Diagnosis not present

## 2017-02-06 DIAGNOSIS — M47816 Spondylosis without myelopathy or radiculopathy, lumbar region: Secondary | ICD-10-CM | POA: Diagnosis not present

## 2017-02-09 DIAGNOSIS — M4306 Spondylolysis, lumbar region: Secondary | ICD-10-CM | POA: Diagnosis not present

## 2017-02-09 DIAGNOSIS — M810 Age-related osteoporosis without current pathological fracture: Secondary | ICD-10-CM | POA: Diagnosis not present

## 2017-02-09 DIAGNOSIS — Z4789 Encounter for other orthopedic aftercare: Secondary | ICD-10-CM | POA: Diagnosis not present

## 2017-02-09 DIAGNOSIS — F0390 Unspecified dementia without behavioral disturbance: Secondary | ICD-10-CM | POA: Diagnosis not present

## 2017-02-09 DIAGNOSIS — M069 Rheumatoid arthritis, unspecified: Secondary | ICD-10-CM | POA: Diagnosis not present

## 2017-02-09 DIAGNOSIS — M47816 Spondylosis without myelopathy or radiculopathy, lumbar region: Secondary | ICD-10-CM | POA: Diagnosis not present

## 2017-02-12 ENCOUNTER — Telehealth: Payer: Self-pay | Admitting: Neurology

## 2017-02-12 NOTE — Telephone Encounter (Signed)
Called patient. Advised [facility] called. I cannot call them back d/t HIPPA law. We do not have signed form stating we have permission from her. She states she requested to have a power wheelchair from them. She does not plan on using all the time. Only when she goes on outings with her family.  She states Dr. Carloyn Manner (who did her back surgery) would not prescribe this for her.  She is wanting to know if Dr. Jannifer Franklin can refer her to be evaluated for this. Advised he is out of the office until next week. I will send him a message for when he returns. She verbalized understanding.   She has a follow up with MM,NP 03/10/17.

## 2017-02-12 NOTE — Telephone Encounter (Signed)
I will see the patient at the end of January, I will assess the patient at that time in regards to her need for a power wheelchair.  If it looks like she needs a power chair, I can make the referral then.

## 2017-02-12 NOTE — Telephone Encounter (Signed)
Buffy with [facility]  requesting the pt demographics, notes and prescription to evaluate. Any questions call at [phone] Fax is [phone]

## 2017-02-15 DIAGNOSIS — R300 Dysuria: Secondary | ICD-10-CM | POA: Diagnosis not present

## 2017-02-15 DIAGNOSIS — M4807 Spinal stenosis, lumbosacral region: Secondary | ICD-10-CM | POA: Diagnosis not present

## 2017-02-15 DIAGNOSIS — E559 Vitamin D deficiency, unspecified: Secondary | ICD-10-CM | POA: Diagnosis not present

## 2017-02-16 DIAGNOSIS — M47816 Spondylosis without myelopathy or radiculopathy, lumbar region: Secondary | ICD-10-CM | POA: Diagnosis not present

## 2017-02-16 DIAGNOSIS — Z4789 Encounter for other orthopedic aftercare: Secondary | ICD-10-CM | POA: Diagnosis not present

## 2017-02-16 DIAGNOSIS — M4306 Spondylolysis, lumbar region: Secondary | ICD-10-CM | POA: Diagnosis not present

## 2017-02-16 DIAGNOSIS — F0390 Unspecified dementia without behavioral disturbance: Secondary | ICD-10-CM | POA: Diagnosis not present

## 2017-02-16 DIAGNOSIS — M810 Age-related osteoporosis without current pathological fracture: Secondary | ICD-10-CM | POA: Diagnosis not present

## 2017-02-16 DIAGNOSIS — M069 Rheumatoid arthritis, unspecified: Secondary | ICD-10-CM | POA: Diagnosis not present

## 2017-02-17 DIAGNOSIS — M810 Age-related osteoporosis without current pathological fracture: Secondary | ICD-10-CM | POA: Diagnosis not present

## 2017-02-17 DIAGNOSIS — M47816 Spondylosis without myelopathy or radiculopathy, lumbar region: Secondary | ICD-10-CM | POA: Diagnosis not present

## 2017-02-17 DIAGNOSIS — M4306 Spondylolysis, lumbar region: Secondary | ICD-10-CM | POA: Diagnosis not present

## 2017-02-17 DIAGNOSIS — F0390 Unspecified dementia without behavioral disturbance: Secondary | ICD-10-CM | POA: Diagnosis not present

## 2017-02-17 DIAGNOSIS — Z4789 Encounter for other orthopedic aftercare: Secondary | ICD-10-CM | POA: Diagnosis not present

## 2017-02-17 DIAGNOSIS — M069 Rheumatoid arthritis, unspecified: Secondary | ICD-10-CM | POA: Diagnosis not present

## 2017-02-19 DIAGNOSIS — Z4789 Encounter for other orthopedic aftercare: Secondary | ICD-10-CM | POA: Diagnosis not present

## 2017-02-19 DIAGNOSIS — M069 Rheumatoid arthritis, unspecified: Secondary | ICD-10-CM | POA: Diagnosis not present

## 2017-02-19 DIAGNOSIS — F0390 Unspecified dementia without behavioral disturbance: Secondary | ICD-10-CM | POA: Diagnosis not present

## 2017-02-19 DIAGNOSIS — M4306 Spondylolysis, lumbar region: Secondary | ICD-10-CM | POA: Diagnosis not present

## 2017-02-19 DIAGNOSIS — M47816 Spondylosis without myelopathy or radiculopathy, lumbar region: Secondary | ICD-10-CM | POA: Diagnosis not present

## 2017-02-19 DIAGNOSIS — M810 Age-related osteoporosis without current pathological fracture: Secondary | ICD-10-CM | POA: Diagnosis not present

## 2017-02-27 DIAGNOSIS — F0390 Unspecified dementia without behavioral disturbance: Secondary | ICD-10-CM | POA: Diagnosis not present

## 2017-02-27 DIAGNOSIS — M47816 Spondylosis without myelopathy or radiculopathy, lumbar region: Secondary | ICD-10-CM | POA: Diagnosis not present

## 2017-02-27 DIAGNOSIS — M810 Age-related osteoporosis without current pathological fracture: Secondary | ICD-10-CM | POA: Diagnosis not present

## 2017-02-27 DIAGNOSIS — M069 Rheumatoid arthritis, unspecified: Secondary | ICD-10-CM | POA: Diagnosis not present

## 2017-02-27 DIAGNOSIS — Z4789 Encounter for other orthopedic aftercare: Secondary | ICD-10-CM | POA: Diagnosis not present

## 2017-02-27 DIAGNOSIS — M4306 Spondylolysis, lumbar region: Secondary | ICD-10-CM | POA: Diagnosis not present

## 2017-03-03 ENCOUNTER — Telehealth: Payer: Self-pay | Admitting: Neurology

## 2017-03-03 NOTE — Telephone Encounter (Signed)
I called the patient.  The patient is to start [place] in February 2019.  She has had back surgery in November at the L4-5 level, she has had 2 months of rehab at a facility, and now is getting home health therapy.  She may have injured her left knee with physical therapy, she is having some discomfort with this.  She is having some difficulty walking, she is able to take a few steps with a walker.  Her ability to ambulate was quite limited even before surgery.  I will see her as a work and revisit sometime before her infusion of Ocrevus.

## 2017-03-03 NOTE — Telephone Encounter (Signed)
Pt recently had surgery(dis 4 &5 put rod in) and is wanting to see Stephanie Perez or Stephanie Perez asap and wanting to know if she can still have infusions since the surgery. Pt was in rehab afterwards and couldn't walk at all she wasn't sure if it was MS related or surgery related, pt is just very concerned and wanting a call back to discuss everything with RN

## 2017-03-04 DIAGNOSIS — M069 Rheumatoid arthritis, unspecified: Secondary | ICD-10-CM | POA: Diagnosis not present

## 2017-03-04 DIAGNOSIS — M4306 Spondylolysis, lumbar region: Secondary | ICD-10-CM | POA: Diagnosis not present

## 2017-03-04 DIAGNOSIS — M47816 Spondylosis without myelopathy or radiculopathy, lumbar region: Secondary | ICD-10-CM | POA: Diagnosis not present

## 2017-03-04 DIAGNOSIS — F0390 Unspecified dementia without behavioral disturbance: Secondary | ICD-10-CM | POA: Diagnosis not present

## 2017-03-04 DIAGNOSIS — M810 Age-related osteoporosis without current pathological fracture: Secondary | ICD-10-CM | POA: Diagnosis not present

## 2017-03-04 DIAGNOSIS — Z4789 Encounter for other orthopedic aftercare: Secondary | ICD-10-CM | POA: Diagnosis not present

## 2017-03-04 NOTE — Telephone Encounter (Signed)
Pt called back and accepted appt date/time. Scheduled pt with Dr. Jannifer Franklin.

## 2017-03-04 NOTE — Telephone Encounter (Signed)
Called, LVM for pt to call office back to schedule work in visit per CW,MD request.   Can offer 03/16/17 at 12pm, check in 1130am if she calls back, thank you

## 2017-03-06 DIAGNOSIS — M069 Rheumatoid arthritis, unspecified: Secondary | ICD-10-CM | POA: Diagnosis not present

## 2017-03-06 DIAGNOSIS — M4306 Spondylolysis, lumbar region: Secondary | ICD-10-CM | POA: Diagnosis not present

## 2017-03-06 DIAGNOSIS — F0390 Unspecified dementia without behavioral disturbance: Secondary | ICD-10-CM | POA: Diagnosis not present

## 2017-03-06 DIAGNOSIS — M810 Age-related osteoporosis without current pathological fracture: Secondary | ICD-10-CM | POA: Diagnosis not present

## 2017-03-06 DIAGNOSIS — M47816 Spondylosis without myelopathy or radiculopathy, lumbar region: Secondary | ICD-10-CM | POA: Diagnosis not present

## 2017-03-06 DIAGNOSIS — Z4789 Encounter for other orthopedic aftercare: Secondary | ICD-10-CM | POA: Diagnosis not present

## 2017-03-10 ENCOUNTER — Ambulatory Visit: Payer: Medicare Other | Admitting: Adult Health

## 2017-03-11 DIAGNOSIS — E782 Mixed hyperlipidemia: Secondary | ICD-10-CM | POA: Diagnosis not present

## 2017-03-11 DIAGNOSIS — Z4789 Encounter for other orthopedic aftercare: Secondary | ICD-10-CM | POA: Diagnosis not present

## 2017-03-11 DIAGNOSIS — M47816 Spondylosis without myelopathy or radiculopathy, lumbar region: Secondary | ICD-10-CM | POA: Diagnosis not present

## 2017-03-11 DIAGNOSIS — G35 Multiple sclerosis: Secondary | ICD-10-CM | POA: Diagnosis not present

## 2017-03-11 DIAGNOSIS — M797 Fibromyalgia: Secondary | ICD-10-CM | POA: Diagnosis not present

## 2017-03-11 DIAGNOSIS — M069 Rheumatoid arthritis, unspecified: Secondary | ICD-10-CM | POA: Diagnosis not present

## 2017-03-11 DIAGNOSIS — M810 Age-related osteoporosis without current pathological fracture: Secondary | ICD-10-CM | POA: Diagnosis not present

## 2017-03-11 DIAGNOSIS — M545 Low back pain: Secondary | ICD-10-CM | POA: Diagnosis not present

## 2017-03-11 DIAGNOSIS — M1711 Unilateral primary osteoarthritis, right knee: Secondary | ICD-10-CM | POA: Diagnosis not present

## 2017-03-11 DIAGNOSIS — I10 Essential (primary) hypertension: Secondary | ICD-10-CM | POA: Diagnosis not present

## 2017-03-11 DIAGNOSIS — M4306 Spondylolysis, lumbar region: Secondary | ICD-10-CM | POA: Diagnosis not present

## 2017-03-11 DIAGNOSIS — M05419 Rheumatoid myopathy with rheumatoid arthritis of unspecified shoulder: Secondary | ICD-10-CM | POA: Diagnosis not present

## 2017-03-11 DIAGNOSIS — F0390 Unspecified dementia without behavioral disturbance: Secondary | ICD-10-CM | POA: Diagnosis not present

## 2017-03-13 DIAGNOSIS — M47816 Spondylosis without myelopathy or radiculopathy, lumbar region: Secondary | ICD-10-CM | POA: Diagnosis not present

## 2017-03-13 DIAGNOSIS — F0390 Unspecified dementia without behavioral disturbance: Secondary | ICD-10-CM | POA: Diagnosis not present

## 2017-03-13 DIAGNOSIS — M810 Age-related osteoporosis without current pathological fracture: Secondary | ICD-10-CM | POA: Diagnosis not present

## 2017-03-13 DIAGNOSIS — Z4789 Encounter for other orthopedic aftercare: Secondary | ICD-10-CM | POA: Diagnosis not present

## 2017-03-13 DIAGNOSIS — M069 Rheumatoid arthritis, unspecified: Secondary | ICD-10-CM | POA: Diagnosis not present

## 2017-03-13 DIAGNOSIS — M4306 Spondylolysis, lumbar region: Secondary | ICD-10-CM | POA: Diagnosis not present

## 2017-03-16 ENCOUNTER — Encounter (INDEPENDENT_AMBULATORY_CARE_PROVIDER_SITE_OTHER): Payer: Self-pay

## 2017-03-16 ENCOUNTER — Ambulatory Visit (INDEPENDENT_AMBULATORY_CARE_PROVIDER_SITE_OTHER): Payer: Medicare Other | Admitting: Neurology

## 2017-03-16 ENCOUNTER — Ambulatory Visit
Admission: RE | Admit: 2017-03-16 | Discharge: 2017-03-16 | Disposition: A | Discharge status: Home / Self Care | Payer: Medicare Other | Source: Ambulatory Visit | Attending: Neurology | Admitting: Neurology

## 2017-03-16 ENCOUNTER — Encounter: Payer: Self-pay | Admitting: Neurology

## 2017-03-16 VITALS — BP 147/67 | HR 63 | Ht 66.0 in | Wt 155.5 lb

## 2017-03-16 DIAGNOSIS — G8929 Other chronic pain: Secondary | ICD-10-CM

## 2017-03-16 DIAGNOSIS — M25562 Pain in left knee: Secondary | ICD-10-CM

## 2017-03-16 DIAGNOSIS — R269 Unspecified abnormalities of gait and mobility: Secondary | ICD-10-CM

## 2017-03-16 DIAGNOSIS — M545 Low back pain: Secondary | ICD-10-CM | POA: Diagnosis not present

## 2017-03-16 DIAGNOSIS — G35 Multiple sclerosis: Secondary | ICD-10-CM

## 2017-03-16 DIAGNOSIS — M47816 Spondylosis without myelopathy or radiculopathy, lumbar region: Secondary | ICD-10-CM | POA: Diagnosis not present

## 2017-03-16 DIAGNOSIS — M5442 Lumbago with sciatica, left side: Principal | ICD-10-CM

## 2017-03-16 DIAGNOSIS — S8992XA Unspecified injury of left lower leg, initial encounter: Secondary | ICD-10-CM | POA: Diagnosis not present

## 2017-03-16 NOTE — Patient Instructions (Signed)
   We will get XR of the low back and the left knee.

## 2017-03-16 NOTE — Progress Notes (Signed)
Reason for visit: Multiple sclerosis  Stephanie Perez is an 77 y.o. female  History of present illness:  Stephanie Perez is a 34 year old right-handed white female with a history of multiple sclerosis associated with a gait disorder.  The patient has undergone lumbosacral spine surgery through Dr. Glenna Fellows in the fall 2018.  The patient has spent some time in a rehab facility, the patient indicates that during this rehab she had an injury to the left knee, she is having pain in the left knee with weightbearing.  She has fallen on several occasions when she has tried to stoop over to pick up something, she has had some left back pain that has occurred following the falls.  The patient again indicates the back pain occurs with weightbearing, and tends to go away with sitting or lying down.  The patient has weakness in both legs, left greater than right.  She is actually improving some with her ability to ambulate even though the pain is more significant.  She is on Ocrevus for her multiple sclerosis, she will get an injection within the next several weeks.  The patient follows up through this office for an evaluation.  Past Medical History:  Diagnosis Date  . Abnormality of gait   . Anxiety   . Depression   . Fibromyalgia   . Low back pain 10/31/2014  . Lumbago   . Multiple sclerosis ([place])   . Neurogenic bladder   . Rheumatoid arteritis   . Spastic paraparesis     Past Surgical History:  Procedure Laterality Date  . ABDOMINAL HYSTERECTOMY    . APPENDECTOMY    . FRACTURE SURGERY    . GALLBLADDER SURGERY N/A   . VASCULAR SURGERY Left    Lower extremity    Family History  Problem Relation Age of Onset  . Lung cancer Mother   . Heart disease Father   . Other Father   . Multiple sclerosis Brother     Social history:  reports that  has never smoked. she has never used smokeless tobacco. She reports that she does not drink alcohol or use drugs.    Allergies  Allergen Reactions  .  Bactrim [Sulfamethoxazole-Trimethoprim]   . Codeine Sulfate Nausea And Vomiting  . Lyrica [Pregabalin] Other (See Comments)    Chest pain, difficulty swallowing  . Zanaflex [Tizanidine Hcl]   . Sulfa Antibiotics Diarrhea    Medications:  Prior to Admission medications   Medication Sig Start Date End Date Taking? Authorizing Provider  aspirin 81 MG tablet Take 81 mg by mouth daily.   Yes [provider]  baclofen (LIORESAL) 10 MG tablet Take 0.5 tablets (5 mg total) by mouth 2 (two) times daily. Patient taking differently: Take 2.5 mg by mouth 2 (two) times daily.  08/17/15  Yes Kathrynn Ducking, MD  clonazePAM (KLONOPIN) 0.5 MG tablet Take 0.5 mg by mouth at bedtime.    Yes [provider]  estradiol (ESTRACE) 0.5 MG tablet Take 1 tablet by mouth daily. 01/03/15  Yes [provider]  hydroxychloroquine (PLAQUENIL) 200 MG tablet Take 2 tablets (400 mg total) by mouth daily. 07/03/16  Yes Kathrynn Ducking, MD  metoprolol tartrate (LOPRESSOR) 25 MG tablet Take 25 mg by mouth 2 (two) times daily.  07/14/13  Yes [provider]  omeprazole (PRILOSEC) 20 MG capsule Take 1 capsule (20 mg total) by mouth daily. 05/19/12  Yes Kathrynn Ducking, MD  predniSONE (DELTASONE) 5 MG tablet TAKE 1  TABLET(5 MG) BY MOUTH DAILY WITH BREAKFAST 02/06/17  Yes Kathrynn Ducking, MD  traMADol (ULTRAM) 50 MG tablet Take 50 mg by mouth every 6 (six) hours as needed for pain.   Yes [provider]  valsartan-hydrochlorothiazide (DIOVAN-HCT) 320-25 MG tablet Take 1 tablet by mouth daily. 11/23/14  Yes [provider]  venlafaxine XR (EFFEXOR XR) 150 MG 24 hr capsule Take 1 capsule (150 mg total) by mouth daily with breakfast. 04/24/16  Yes Kathrynn Ducking, MD    ROS:  Out of a complete 14 system review of symptoms, the patient complains only of the following symptoms, and all other reviewed systems are negative.  Restless legs, snoring, sleep talking Joint pain,  joint swelling, back pain, aching muscles, muscle cramps, walking difficulty, neck pain, neck stiffness Numbness, weakness Depression, anxiety  Blood pressure (!) 147/67, pulse 63, height 5\' 6"  (1.676 m), weight 155 lb 8 oz (70.5 kg).  Physical Exam  General: The patient is alert and cooperative at the time of the examination.  Skin: No significant peripheral edema is noted.   Neurologic Exam  Mental status: The patient is alert and oriented x 3 at the time of the examination. The patient has apparent normal recent and remote memory, with an apparently normal attention span and concentration ability.   Cranial nerves: Facial symmetry is present. Speech is normal, no aphasia or dysarthria is noted. Extraocular movements are full. Visual fields are full.  Pupils are equal, round, and reactive to light.  Discs are flat bilaterally.  Motor: The patient has good strength in the upper extremities.  With the lower extremities, the patient has 4-/5 strength in hip flexion bilaterally, slightly weaker on the left.  The patient has mild weakness with knee flexion extension bilaterally.  Sensory examination: Soft touch sensation is symmetric on the face, arms, and legs.  Coordination: The patient has good finger-nose-finger bilaterally, but she has some difficulty performing heel shin bilaterally.  Gait and station: The patient has the ability to walk with a walker, she has a diplegic gait.  Tandem gait was not attempted.  Romberg is negative but is unsteady.  Reflexes: Deep tendon reflexes are symmetric.   Assessment/Plan:  1.  Multiple sclerosis  2.  Gait disorder  3.  Left back and leg pain  The patient has had some problems with pain in the left knee that developed following physical therapy.  The patient indicates that her physical therapist may have injured the knee.  The patient has had some back pain that has come on following several falls.  She will be set up for x-rays of the  low back and left knee.  She may require an orthopedic evaluation.  She will be seeing Dr. Glenna Fellows tomorrow.  The patient will follow-up in 6 months.  She will get her Ocrevus injection within the next several weeks.  Jill Alexanders MD 03/16/2017 12:26 PM  [facility] [address] [place], [place] [address]  Phone [phone] Fax [phone]

## 2017-03-17 ENCOUNTER — Telehealth: Payer: Self-pay | Admitting: Neurology

## 2017-03-17 DIAGNOSIS — M4716 Other spondylosis with myelopathy, lumbar region: Secondary | ICD-10-CM | POA: Diagnosis not present

## 2017-03-17 DIAGNOSIS — M4316 Spondylolisthesis, lumbar region: Secondary | ICD-10-CM | POA: Diagnosis not present

## 2017-03-17 DIAGNOSIS — M48061 Spinal stenosis, lumbar region without neurogenic claudication: Secondary | ICD-10-CM | POA: Diagnosis not present

## 2017-03-17 DIAGNOSIS — Z6825 Body mass index (BMI) 25.0-25.9, adult: Secondary | ICD-10-CM | POA: Diagnosis not present

## 2017-03-17 NOTE — Telephone Encounter (Signed)
I called the patient.  X-ray of the left knee does not show significant arthritis, x-ray of the low back does not show any fractures or changes to the surgical site.  I discussed this with the patient.   X-ray left knee 03/16/17:  IMPRESSION: No fracture or dislocation. No joint effusion. No appreciable arthropathy. There is vascular atherosclerosis in the popliteal and distal superficial femoral arteries.   X-ray lumbar 03/16/17:  IMPRESSION: Postsurgical changes of L4-L5 posterior fusion.  Osseous demineralization with degenerative changes and mild levoconvex scoliosis.  No acute abnormalities.

## 2017-03-23 NOTE — Telephone Encounter (Signed)
I called the patient.  The patient has requested that we fax office note, and x-ray reports to her physical therapist.  I will go ahead and do this.

## 2017-03-23 NOTE — Telephone Encounter (Signed)
Pt is wanting to know if we can make [facility] aware of her back / left leg pain. The order was sent for gate training but pt feels that need to know and order be readjusted(fax # [phone])

## 2017-03-24 DIAGNOSIS — R2689 Other abnormalities of gait and mobility: Secondary | ICD-10-CM | POA: Diagnosis not present

## 2017-03-24 DIAGNOSIS — G35 Multiple sclerosis: Secondary | ICD-10-CM | POA: Diagnosis not present

## 2017-03-24 DIAGNOSIS — M6281 Muscle weakness (generalized): Secondary | ICD-10-CM | POA: Diagnosis not present

## 2017-03-27 DIAGNOSIS — E559 Vitamin D deficiency, unspecified: Secondary | ICD-10-CM | POA: Diagnosis not present

## 2017-03-27 DIAGNOSIS — G35 Multiple sclerosis: Secondary | ICD-10-CM | POA: Diagnosis not present

## 2017-03-27 DIAGNOSIS — M5441 Lumbago with sciatica, right side: Secondary | ICD-10-CM | POA: Diagnosis not present

## 2017-03-27 DIAGNOSIS — M130 Polyarthritis, unspecified: Secondary | ICD-10-CM | POA: Diagnosis not present

## 2017-03-27 DIAGNOSIS — Z79899 Other long term (current) drug therapy: Secondary | ICD-10-CM | POA: Diagnosis not present

## 2017-03-27 DIAGNOSIS — M5442 Lumbago with sciatica, left side: Secondary | ICD-10-CM | POA: Diagnosis not present

## 2017-03-31 DIAGNOSIS — M79604 Pain in right leg: Secondary | ICD-10-CM | POA: Diagnosis not present

## 2017-03-31 DIAGNOSIS — I8392 Asymptomatic varicose veins of left lower extremity: Secondary | ICD-10-CM | POA: Diagnosis not present

## 2017-03-31 DIAGNOSIS — I493 Ventricular premature depolarization: Secondary | ICD-10-CM | POA: Diagnosis not present

## 2017-03-31 DIAGNOSIS — I10 Essential (primary) hypertension: Secondary | ICD-10-CM | POA: Diagnosis not present

## 2017-03-31 DIAGNOSIS — M79605 Pain in left leg: Secondary | ICD-10-CM | POA: Diagnosis not present

## 2017-03-31 DIAGNOSIS — I351 Nonrheumatic aortic (valve) insufficiency: Secondary | ICD-10-CM | POA: Diagnosis not present

## 2017-03-31 DIAGNOSIS — R600 Localized edema: Secondary | ICD-10-CM | POA: Diagnosis not present

## 2017-04-03 DIAGNOSIS — R2689 Other abnormalities of gait and mobility: Secondary | ICD-10-CM | POA: Diagnosis not present

## 2017-04-03 DIAGNOSIS — M6281 Muscle weakness (generalized): Secondary | ICD-10-CM | POA: Diagnosis not present

## 2017-04-03 DIAGNOSIS — G35 Multiple sclerosis: Secondary | ICD-10-CM | POA: Diagnosis not present

## 2017-04-28 DIAGNOSIS — M7989 Other specified soft tissue disorders: Secondary | ICD-10-CM | POA: Diagnosis not present

## 2017-04-28 DIAGNOSIS — Z6825 Body mass index (BMI) 25.0-25.9, adult: Secondary | ICD-10-CM | POA: Diagnosis not present

## 2017-04-28 DIAGNOSIS — M4716 Other spondylosis with myelopathy, lumbar region: Secondary | ICD-10-CM | POA: Diagnosis not present

## 2017-04-28 DIAGNOSIS — M25572 Pain in left ankle and joints of left foot: Secondary | ICD-10-CM | POA: Diagnosis not present

## 2017-04-28 DIAGNOSIS — S99912A Unspecified injury of left ankle, initial encounter: Secondary | ICD-10-CM | POA: Diagnosis not present

## 2017-04-28 DIAGNOSIS — S3992XA Unspecified injury of lower back, initial encounter: Secondary | ICD-10-CM | POA: Diagnosis not present

## 2017-05-04 DIAGNOSIS — G35 Multiple sclerosis: Secondary | ICD-10-CM | POA: Diagnosis not present

## 2017-05-09 ENCOUNTER — Telehealth: Payer: Self-pay | Admitting: Neurology

## 2017-05-09 MED ORDER — MECLIZINE HCL 25 MG PO TABS: 25.0000 mg | ORAL_TABLET | Freq: Three times a day (TID) | ORAL | 1 refills | Status: AC | PRN

## 2017-05-09 MED ORDER — ONDANSETRON 4 MG PO TBDP: 4.0000 mg | ORAL_TABLET | Freq: Three times a day (TID) | ORAL | 1 refills | Status: DC | PRN

## 2017-05-09 NOTE — Telephone Encounter (Signed)
I called the patient.  The patient got an Ocrevus infusion 5 days ago, she immediately began having problems with feeling dizzy, vertiginous, nauseated.  When she moves, that the dizziness gets worse.  This has persisted and gotten somewhat more significant over time.  She is able to drink fluids, she is not eating much.  She is not running any fevers or chills.  I will call in Zofran, meclizine.  If the symptoms persist, we will need to do blood work next week.

## 2017-05-15 DIAGNOSIS — E782 Mixed hyperlipidemia: Secondary | ICD-10-CM | POA: Diagnosis not present

## 2017-05-15 DIAGNOSIS — G35 Multiple sclerosis: Secondary | ICD-10-CM | POA: Diagnosis not present

## 2017-05-15 DIAGNOSIS — M797 Fibromyalgia: Secondary | ICD-10-CM | POA: Diagnosis not present

## 2017-05-15 DIAGNOSIS — M05419 Rheumatoid myopathy with rheumatoid arthritis of unspecified shoulder: Secondary | ICD-10-CM | POA: Diagnosis not present

## 2017-05-15 DIAGNOSIS — M810 Age-related osteoporosis without current pathological fracture: Secondary | ICD-10-CM | POA: Diagnosis not present

## 2017-05-15 DIAGNOSIS — Z6825 Body mass index (BMI) 25.0-25.9, adult: Secondary | ICD-10-CM | POA: Diagnosis not present

## 2017-05-15 DIAGNOSIS — M545 Low back pain: Secondary | ICD-10-CM | POA: Diagnosis not present

## 2017-05-15 DIAGNOSIS — I10 Essential (primary) hypertension: Secondary | ICD-10-CM | POA: Diagnosis not present

## 2017-05-26 DIAGNOSIS — M4716 Other spondylosis with myelopathy, lumbar region: Secondary | ICD-10-CM | POA: Diagnosis not present

## 2017-05-26 DIAGNOSIS — Z6825 Body mass index (BMI) 25.0-25.9, adult: Secondary | ICD-10-CM | POA: Diagnosis not present

## 2017-06-02 DIAGNOSIS — E559 Vitamin D deficiency, unspecified: Secondary | ICD-10-CM | POA: Diagnosis not present

## 2017-06-02 DIAGNOSIS — E782 Mixed hyperlipidemia: Secondary | ICD-10-CM | POA: Diagnosis not present

## 2017-06-02 DIAGNOSIS — G35 Multiple sclerosis: Secondary | ICD-10-CM | POA: Diagnosis not present

## 2017-06-02 DIAGNOSIS — R5383 Other fatigue: Secondary | ICD-10-CM | POA: Diagnosis not present

## 2017-06-02 DIAGNOSIS — M797 Fibromyalgia: Secondary | ICD-10-CM | POA: Diagnosis not present

## 2017-06-02 DIAGNOSIS — I10 Essential (primary) hypertension: Secondary | ICD-10-CM | POA: Diagnosis not present

## 2017-06-02 DIAGNOSIS — E785 Hyperlipidemia, unspecified: Secondary | ICD-10-CM | POA: Diagnosis not present

## 2017-06-03 DIAGNOSIS — G35 Multiple sclerosis: Secondary | ICD-10-CM | POA: Diagnosis not present

## 2017-06-03 DIAGNOSIS — E785 Hyperlipidemia, unspecified: Secondary | ICD-10-CM | POA: Diagnosis not present

## 2017-06-03 DIAGNOSIS — I10 Essential (primary) hypertension: Secondary | ICD-10-CM | POA: Diagnosis not present

## 2017-06-08 ENCOUNTER — Ambulatory Visit: Payer: Medicare Other | Admitting: Adult Health

## 2017-06-08 ENCOUNTER — Encounter

## 2017-06-17 DIAGNOSIS — G8929 Other chronic pain: Secondary | ICD-10-CM | POA: Diagnosis not present

## 2017-06-17 DIAGNOSIS — M130 Polyarthritis, unspecified: Secondary | ICD-10-CM | POA: Diagnosis not present

## 2017-06-17 DIAGNOSIS — E559 Vitamin D deficiency, unspecified: Secondary | ICD-10-CM | POA: Diagnosis not present

## 2017-06-17 DIAGNOSIS — M79642 Pain in left hand: Secondary | ICD-10-CM | POA: Diagnosis not present

## 2017-06-17 DIAGNOSIS — M7071 Other bursitis of hip, right hip: Secondary | ICD-10-CM | POA: Diagnosis not present

## 2017-06-17 DIAGNOSIS — Z79899 Other long term (current) drug therapy: Secondary | ICD-10-CM | POA: Diagnosis not present

## 2017-06-17 DIAGNOSIS — M79641 Pain in right hand: Secondary | ICD-10-CM | POA: Diagnosis not present

## 2017-06-17 DIAGNOSIS — M5442 Lumbago with sciatica, left side: Secondary | ICD-10-CM | POA: Diagnosis not present

## 2017-06-17 DIAGNOSIS — M7072 Other bursitis of hip, left hip: Secondary | ICD-10-CM | POA: Diagnosis not present

## 2017-06-17 DIAGNOSIS — M5441 Lumbago with sciatica, right side: Secondary | ICD-10-CM | POA: Diagnosis not present

## 2017-06-23 DIAGNOSIS — R634 Abnormal weight loss: Secondary | ICD-10-CM | POA: Diagnosis not present

## 2017-06-23 DIAGNOSIS — D508 Other iron deficiency anemias: Secondary | ICD-10-CM | POA: Diagnosis not present

## 2017-06-23 DIAGNOSIS — K219 Gastro-esophageal reflux disease without esophagitis: Secondary | ICD-10-CM | POA: Diagnosis not present

## 2017-06-23 DIAGNOSIS — K921 Melena: Secondary | ICD-10-CM | POA: Diagnosis not present

## 2017-06-23 DIAGNOSIS — K5900 Constipation, unspecified: Secondary | ICD-10-CM | POA: Diagnosis not present

## 2017-06-25 DIAGNOSIS — Z6822 Body mass index (BMI) 22.0-22.9, adult: Secondary | ICD-10-CM | POA: Diagnosis not present

## 2017-06-25 DIAGNOSIS — Z882 Allergy status to sulfonamides status: Secondary | ICD-10-CM | POA: Diagnosis not present

## 2017-06-25 DIAGNOSIS — K922 Gastrointestinal hemorrhage, unspecified: Secondary | ICD-10-CM | POA: Diagnosis not present

## 2017-06-25 DIAGNOSIS — K449 Diaphragmatic hernia without obstruction or gangrene: Secondary | ICD-10-CM | POA: Diagnosis not present

## 2017-06-25 DIAGNOSIS — R634 Abnormal weight loss: Secondary | ICD-10-CM | POA: Diagnosis not present

## 2017-06-25 DIAGNOSIS — K921 Melena: Secondary | ICD-10-CM | POA: Diagnosis not present

## 2017-06-25 DIAGNOSIS — K5900 Constipation, unspecified: Secondary | ICD-10-CM | POA: Diagnosis not present

## 2017-06-25 DIAGNOSIS — K2980 Duodenitis without bleeding: Secondary | ICD-10-CM | POA: Diagnosis not present

## 2017-06-25 DIAGNOSIS — Z885 Allergy status to narcotic agent status: Secondary | ICD-10-CM | POA: Diagnosis not present

## 2017-06-25 DIAGNOSIS — R195 Other fecal abnormalities: Secondary | ICD-10-CM | POA: Diagnosis not present

## 2017-06-25 DIAGNOSIS — I10 Essential (primary) hypertension: Secondary | ICD-10-CM | POA: Diagnosis not present

## 2017-06-25 DIAGNOSIS — G35 Multiple sclerosis: Secondary | ICD-10-CM | POA: Diagnosis not present

## 2017-06-25 DIAGNOSIS — K219 Gastro-esophageal reflux disease without esophagitis: Secondary | ICD-10-CM | POA: Diagnosis not present

## 2017-06-25 DIAGNOSIS — D649 Anemia, unspecified: Secondary | ICD-10-CM | POA: Diagnosis not present

## 2017-06-25 DIAGNOSIS — E785 Hyperlipidemia, unspecified: Secondary | ICD-10-CM | POA: Diagnosis not present

## 2017-06-25 DIAGNOSIS — D508 Other iron deficiency anemias: Secondary | ICD-10-CM | POA: Diagnosis not present

## 2017-06-29 DIAGNOSIS — F329 Major depressive disorder, single episode, unspecified: Secondary | ICD-10-CM | POA: Diagnosis not present

## 2017-06-29 DIAGNOSIS — G35 Multiple sclerosis: Secondary | ICD-10-CM | POA: Diagnosis not present

## 2017-06-29 DIAGNOSIS — R2681 Unsteadiness on feet: Secondary | ICD-10-CM | POA: Diagnosis not present

## 2017-06-29 DIAGNOSIS — R296 Repeated falls: Secondary | ICD-10-CM | POA: Diagnosis not present

## 2017-07-08 DIAGNOSIS — Z79899 Other long term (current) drug therapy: Secondary | ICD-10-CM | POA: Diagnosis not present

## 2017-07-09 DIAGNOSIS — D508 Other iron deficiency anemias: Secondary | ICD-10-CM | POA: Diagnosis not present

## 2017-07-09 DIAGNOSIS — R634 Abnormal weight loss: Secondary | ICD-10-CM | POA: Diagnosis not present

## 2017-07-09 DIAGNOSIS — K5900 Constipation, unspecified: Secondary | ICD-10-CM | POA: Diagnosis not present

## 2017-07-09 DIAGNOSIS — K921 Melena: Secondary | ICD-10-CM | POA: Diagnosis not present

## 2017-07-09 DIAGNOSIS — K219 Gastro-esophageal reflux disease without esophagitis: Secondary | ICD-10-CM | POA: Diagnosis not present

## 2017-07-09 DIAGNOSIS — Z1211 Encounter for screening for malignant neoplasm of colon: Secondary | ICD-10-CM | POA: Diagnosis not present

## 2017-07-13 DIAGNOSIS — Z885 Allergy status to narcotic agent status: Secondary | ICD-10-CM | POA: Diagnosis not present

## 2017-07-13 DIAGNOSIS — D649 Anemia, unspecified: Secondary | ICD-10-CM | POA: Diagnosis not present

## 2017-07-13 DIAGNOSIS — K5900 Constipation, unspecified: Secondary | ICD-10-CM | POA: Diagnosis not present

## 2017-07-13 DIAGNOSIS — K5730 Diverticulosis of large intestine without perforation or abscess without bleeding: Secondary | ICD-10-CM | POA: Diagnosis not present

## 2017-07-13 DIAGNOSIS — E785 Hyperlipidemia, unspecified: Secondary | ICD-10-CM | POA: Diagnosis not present

## 2017-07-13 DIAGNOSIS — R634 Abnormal weight loss: Secondary | ICD-10-CM | POA: Diagnosis not present

## 2017-07-13 DIAGNOSIS — Z882 Allergy status to sulfonamides status: Secondary | ICD-10-CM | POA: Diagnosis not present

## 2017-07-13 DIAGNOSIS — Z6822 Body mass index (BMI) 22.0-22.9, adult: Secondary | ICD-10-CM | POA: Diagnosis not present

## 2017-07-13 DIAGNOSIS — K648 Other hemorrhoids: Secondary | ICD-10-CM | POA: Diagnosis not present

## 2017-07-13 DIAGNOSIS — D508 Other iron deficiency anemias: Secondary | ICD-10-CM | POA: Diagnosis not present

## 2017-07-13 DIAGNOSIS — G35 Multiple sclerosis: Secondary | ICD-10-CM | POA: Diagnosis not present

## 2017-07-13 DIAGNOSIS — M069 Rheumatoid arthritis, unspecified: Secondary | ICD-10-CM | POA: Diagnosis not present

## 2017-07-13 DIAGNOSIS — Z888 Allergy status to other drugs, medicaments and biological substances status: Secondary | ICD-10-CM | POA: Diagnosis not present

## 2017-07-13 DIAGNOSIS — K219 Gastro-esophageal reflux disease without esophagitis: Secondary | ICD-10-CM | POA: Diagnosis not present

## 2017-07-13 DIAGNOSIS — R195 Other fecal abnormalities: Secondary | ICD-10-CM | POA: Diagnosis not present

## 2017-07-13 DIAGNOSIS — I10 Essential (primary) hypertension: Secondary | ICD-10-CM | POA: Diagnosis not present

## 2017-07-23 ENCOUNTER — Telehealth: Payer: Self-pay | Admitting: Neurology

## 2017-07-23 ENCOUNTER — Telehealth: Payer: Self-pay | Admitting: *Deleted

## 2017-07-23 MED ORDER — PROMETHAZINE HCL 25 MG PO TABS: 25.0000 mg | ORAL_TABLET | Freq: Four times a day (QID) | ORAL | 2 refills | Status: AC | PRN

## 2017-07-23 NOTE — Addendum Note (Signed)
Addended by: Kathrynn Ducking on: 07/23/2017 01:22 PM   Modules accepted: Orders

## 2017-07-23 NOTE — Telephone Encounter (Signed)
I called the patient.  The patient got her Ocrevus infusion in March, since that time she has had troubles with dizziness and nausea.  She apparently has had some drop in hemoglobin and had received a transfusion of blood, she has been seen by gastroenterologist, she has had an upper and lower GI evaluation, the source of blood loss is not known.  The patient still feels quite bad, she is continued to have nausea and vomiting.  Zofran does not help this much.  The patient will be seen for revisit, I will need to check blood in urine, may need to recheck MRI of the brain.  I will call in a prescription for Phenergan.

## 2017-07-23 NOTE — Telephone Encounter (Signed)
Pt called stating she wanted to speak with Dr Jannifer Franklin because she has MS and she doesn't know what to do.  I offered to send an urgent message to RN Terrence Dupont.  Pt began crying stating she doesn't know what to do.  I advised her to go to ED/call 911 if she feels she is in need of urgent care.  Pt said she will go to ED and ended the call.  Pt has not requested a call back

## 2017-07-23 NOTE — Telephone Encounter (Signed)
Called and spoke w/ pt. Scheduled appt for 07/27/17 at 2:30pm, check in 2:00pm.

## 2017-07-23 NOTE — Telephone Encounter (Signed)
PA for Promethazine 25mg  tablets #30/30 completed via Cover My Meds..Key# [id number] Dx: Nausea/Vomiting (R11.2).  Onset following Ocrevus infusion. Tried and failed Zofran (ineffective)/fim

## 2017-07-23 NOTE — Telephone Encounter (Signed)
PA approved 06/23/17-07/23/18. Case [id number].   Faxed notice of approval to [facility] at [phone]. Received fax confirmation.

## 2017-07-27 ENCOUNTER — Encounter: Payer: Self-pay | Admitting: Neurology

## 2017-07-27 ENCOUNTER — Ambulatory Visit: Payer: Self-pay | Admitting: Neurology

## 2017-07-27 ENCOUNTER — Telehealth: Payer: Self-pay | Admitting: Neurology

## 2017-07-27 NOTE — Telephone Encounter (Signed)
This patient did not show up for revisit appointment today after requesting an urgent evaluation.

## 2017-07-30 DIAGNOSIS — Z1211 Encounter for screening for malignant neoplasm of colon: Secondary | ICD-10-CM | POA: Diagnosis not present

## 2017-07-30 DIAGNOSIS — D508 Other iron deficiency anemias: Secondary | ICD-10-CM | POA: Diagnosis not present

## 2017-07-30 DIAGNOSIS — K5900 Constipation, unspecified: Secondary | ICD-10-CM | POA: Diagnosis not present

## 2017-07-30 DIAGNOSIS — R634 Abnormal weight loss: Secondary | ICD-10-CM | POA: Diagnosis not present

## 2017-07-31 ENCOUNTER — Other Ambulatory Visit: Payer: Self-pay | Admitting: Neurology

## 2017-08-05 DIAGNOSIS — Z23 Encounter for immunization: Secondary | ICD-10-CM | POA: Diagnosis not present

## 2017-08-05 DIAGNOSIS — M05419 Rheumatoid myopathy with rheumatoid arthritis of unspecified shoulder: Secondary | ICD-10-CM | POA: Diagnosis not present

## 2017-08-05 DIAGNOSIS — F331 Major depressive disorder, recurrent, moderate: Secondary | ICD-10-CM | POA: Diagnosis not present

## 2017-08-05 DIAGNOSIS — D529 Folate deficiency anemia, unspecified: Secondary | ICD-10-CM | POA: Diagnosis not present

## 2017-08-05 DIAGNOSIS — M810 Age-related osteoporosis without current pathological fracture: Secondary | ICD-10-CM | POA: Diagnosis not present

## 2017-08-05 DIAGNOSIS — G35 Multiple sclerosis: Secondary | ICD-10-CM | POA: Diagnosis not present

## 2017-08-05 DIAGNOSIS — I10 Essential (primary) hypertension: Secondary | ICD-10-CM | POA: Diagnosis not present

## 2017-08-05 DIAGNOSIS — D500 Iron deficiency anemia secondary to blood loss (chronic): Secondary | ICD-10-CM | POA: Diagnosis not present

## 2017-08-05 DIAGNOSIS — M545 Low back pain: Secondary | ICD-10-CM | POA: Diagnosis not present

## 2017-08-05 DIAGNOSIS — M1711 Unilateral primary osteoarthritis, right knee: Secondary | ICD-10-CM | POA: Diagnosis not present

## 2017-08-05 DIAGNOSIS — D519 Vitamin B12 deficiency anemia, unspecified: Secondary | ICD-10-CM | POA: Diagnosis not present

## 2017-08-05 DIAGNOSIS — M797 Fibromyalgia: Secondary | ICD-10-CM | POA: Diagnosis not present

## 2017-08-05 DIAGNOSIS — E782 Mixed hyperlipidemia: Secondary | ICD-10-CM | POA: Diagnosis not present

## 2017-08-05 DIAGNOSIS — Z6824 Body mass index (BMI) 24.0-24.9, adult: Secondary | ICD-10-CM | POA: Diagnosis not present

## 2017-08-20 DIAGNOSIS — M1711 Unilateral primary osteoarthritis, right knee: Secondary | ICD-10-CM | POA: Diagnosis not present

## 2017-08-20 DIAGNOSIS — M4807 Spinal stenosis, lumbosacral region: Secondary | ICD-10-CM | POA: Diagnosis not present

## 2017-08-20 DIAGNOSIS — E782 Mixed hyperlipidemia: Secondary | ICD-10-CM | POA: Diagnosis not present

## 2017-08-20 DIAGNOSIS — M545 Low back pain: Secondary | ICD-10-CM | POA: Diagnosis not present

## 2017-08-20 DIAGNOSIS — G35 Multiple sclerosis: Secondary | ICD-10-CM | POA: Diagnosis not present

## 2017-08-20 DIAGNOSIS — D500 Iron deficiency anemia secondary to blood loss (chronic): Secondary | ICD-10-CM | POA: Diagnosis not present

## 2017-08-20 DIAGNOSIS — M797 Fibromyalgia: Secondary | ICD-10-CM | POA: Diagnosis not present

## 2017-08-20 DIAGNOSIS — Z6824 Body mass index (BMI) 24.0-24.9, adult: Secondary | ICD-10-CM | POA: Diagnosis not present

## 2017-08-20 DIAGNOSIS — M810 Age-related osteoporosis without current pathological fracture: Secondary | ICD-10-CM | POA: Diagnosis not present

## 2017-08-20 DIAGNOSIS — F331 Major depressive disorder, recurrent, moderate: Secondary | ICD-10-CM | POA: Diagnosis not present

## 2017-08-20 DIAGNOSIS — M05419 Rheumatoid myopathy with rheumatoid arthritis of unspecified shoulder: Secondary | ICD-10-CM | POA: Diagnosis not present

## 2017-08-21 DIAGNOSIS — G35 Multiple sclerosis: Secondary | ICD-10-CM | POA: Diagnosis not present

## 2017-08-21 DIAGNOSIS — M4807 Spinal stenosis, lumbosacral region: Secondary | ICD-10-CM | POA: Diagnosis not present

## 2017-08-21 DIAGNOSIS — M1711 Unilateral primary osteoarthritis, right knee: Secondary | ICD-10-CM | POA: Diagnosis not present

## 2017-08-21 DIAGNOSIS — E782 Mixed hyperlipidemia: Secondary | ICD-10-CM | POA: Diagnosis not present

## 2017-08-21 DIAGNOSIS — M797 Fibromyalgia: Secondary | ICD-10-CM | POA: Diagnosis not present

## 2017-08-21 DIAGNOSIS — M05419 Rheumatoid myopathy with rheumatoid arthritis of unspecified shoulder: Secondary | ICD-10-CM | POA: Diagnosis not present

## 2017-08-25 DIAGNOSIS — M4807 Spinal stenosis, lumbosacral region: Secondary | ICD-10-CM | POA: Diagnosis not present

## 2017-08-25 DIAGNOSIS — M05419 Rheumatoid myopathy with rheumatoid arthritis of unspecified shoulder: Secondary | ICD-10-CM | POA: Diagnosis not present

## 2017-08-25 DIAGNOSIS — M1711 Unilateral primary osteoarthritis, right knee: Secondary | ICD-10-CM | POA: Diagnosis not present

## 2017-08-25 DIAGNOSIS — M797 Fibromyalgia: Secondary | ICD-10-CM | POA: Diagnosis not present

## 2017-08-25 DIAGNOSIS — G35 Multiple sclerosis: Secondary | ICD-10-CM | POA: Diagnosis not present

## 2017-08-25 DIAGNOSIS — E782 Mixed hyperlipidemia: Secondary | ICD-10-CM | POA: Diagnosis not present

## 2017-08-26 DIAGNOSIS — Z6823 Body mass index (BMI) 23.0-23.9, adult: Secondary | ICD-10-CM | POA: Diagnosis not present

## 2017-08-26 DIAGNOSIS — M4716 Other spondylosis with myelopathy, lumbar region: Secondary | ICD-10-CM | POA: Diagnosis not present

## 2017-08-31 DIAGNOSIS — D508 Other iron deficiency anemias: Secondary | ICD-10-CM | POA: Diagnosis not present

## 2017-08-31 DIAGNOSIS — R634 Abnormal weight loss: Secondary | ICD-10-CM | POA: Diagnosis not present

## 2017-09-01 DIAGNOSIS — M05419 Rheumatoid myopathy with rheumatoid arthritis of unspecified shoulder: Secondary | ICD-10-CM | POA: Diagnosis not present

## 2017-09-01 DIAGNOSIS — E782 Mixed hyperlipidemia: Secondary | ICD-10-CM | POA: Diagnosis not present

## 2017-09-01 DIAGNOSIS — M4807 Spinal stenosis, lumbosacral region: Secondary | ICD-10-CM | POA: Diagnosis not present

## 2017-09-01 DIAGNOSIS — M1711 Unilateral primary osteoarthritis, right knee: Secondary | ICD-10-CM | POA: Diagnosis not present

## 2017-09-01 DIAGNOSIS — G35 Multiple sclerosis: Secondary | ICD-10-CM | POA: Diagnosis not present

## 2017-09-01 DIAGNOSIS — M797 Fibromyalgia: Secondary | ICD-10-CM | POA: Diagnosis not present

## 2017-09-02 DIAGNOSIS — K5900 Constipation, unspecified: Secondary | ICD-10-CM | POA: Diagnosis not present

## 2017-09-02 DIAGNOSIS — R634 Abnormal weight loss: Secondary | ICD-10-CM | POA: Diagnosis not present

## 2017-09-02 DIAGNOSIS — Z1211 Encounter for screening for malignant neoplasm of colon: Secondary | ICD-10-CM | POA: Diagnosis not present

## 2017-09-02 DIAGNOSIS — K219 Gastro-esophageal reflux disease without esophagitis: Secondary | ICD-10-CM | POA: Diagnosis not present

## 2017-09-02 DIAGNOSIS — D509 Iron deficiency anemia, unspecified: Secondary | ICD-10-CM | POA: Diagnosis not present

## 2017-09-03 DIAGNOSIS — F331 Major depressive disorder, recurrent, moderate: Secondary | ICD-10-CM | POA: Diagnosis not present

## 2017-09-04 DIAGNOSIS — E782 Mixed hyperlipidemia: Secondary | ICD-10-CM | POA: Diagnosis not present

## 2017-09-04 DIAGNOSIS — M797 Fibromyalgia: Secondary | ICD-10-CM | POA: Diagnosis not present

## 2017-09-04 DIAGNOSIS — M1711 Unilateral primary osteoarthritis, right knee: Secondary | ICD-10-CM | POA: Diagnosis not present

## 2017-09-04 DIAGNOSIS — M05419 Rheumatoid myopathy with rheumatoid arthritis of unspecified shoulder: Secondary | ICD-10-CM | POA: Diagnosis not present

## 2017-09-04 DIAGNOSIS — G35 Multiple sclerosis: Secondary | ICD-10-CM | POA: Diagnosis not present

## 2017-09-04 DIAGNOSIS — M4807 Spinal stenosis, lumbosacral region: Secondary | ICD-10-CM | POA: Diagnosis not present

## 2017-09-07 DIAGNOSIS — M797 Fibromyalgia: Secondary | ICD-10-CM | POA: Diagnosis not present

## 2017-09-07 DIAGNOSIS — G35 Multiple sclerosis: Secondary | ICD-10-CM | POA: Diagnosis not present

## 2017-09-07 DIAGNOSIS — M05419 Rheumatoid myopathy with rheumatoid arthritis of unspecified shoulder: Secondary | ICD-10-CM | POA: Diagnosis not present

## 2017-09-07 DIAGNOSIS — E782 Mixed hyperlipidemia: Secondary | ICD-10-CM | POA: Diagnosis not present

## 2017-09-07 DIAGNOSIS — M4807 Spinal stenosis, lumbosacral region: Secondary | ICD-10-CM | POA: Diagnosis not present

## 2017-09-07 DIAGNOSIS — M1711 Unilateral primary osteoarthritis, right knee: Secondary | ICD-10-CM | POA: Diagnosis not present

## 2017-09-08 DIAGNOSIS — G35 Multiple sclerosis: Secondary | ICD-10-CM | POA: Diagnosis not present

## 2017-09-08 DIAGNOSIS — E785 Hyperlipidemia, unspecified: Secondary | ICD-10-CM | POA: Diagnosis not present

## 2017-09-08 DIAGNOSIS — I10 Essential (primary) hypertension: Secondary | ICD-10-CM | POA: Diagnosis not present

## 2017-09-10 DIAGNOSIS — S79912A Unspecified injury of left hip, initial encounter: Secondary | ICD-10-CM | POA: Diagnosis not present

## 2017-09-10 DIAGNOSIS — S3992XA Unspecified injury of lower back, initial encounter: Secondary | ICD-10-CM | POA: Diagnosis not present

## 2017-09-10 DIAGNOSIS — Z981 Arthrodesis status: Secondary | ICD-10-CM | POA: Diagnosis not present

## 2017-09-10 DIAGNOSIS — M47816 Spondylosis without myelopathy or radiculopathy, lumbar region: Secondary | ICD-10-CM | POA: Diagnosis not present

## 2017-09-10 DIAGNOSIS — M25552 Pain in left hip: Secondary | ICD-10-CM | POA: Diagnosis not present

## 2017-09-10 DIAGNOSIS — M419 Scoliosis, unspecified: Secondary | ICD-10-CM | POA: Diagnosis not present

## 2017-09-10 DIAGNOSIS — I700 Atherosclerosis of aorta: Secondary | ICD-10-CM | POA: Diagnosis not present

## 2017-09-10 DIAGNOSIS — Z9181 History of falling: Secondary | ICD-10-CM | POA: Diagnosis not present

## 2017-09-15 DIAGNOSIS — M4807 Spinal stenosis, lumbosacral region: Secondary | ICD-10-CM | POA: Diagnosis not present

## 2017-09-15 DIAGNOSIS — M797 Fibromyalgia: Secondary | ICD-10-CM | POA: Diagnosis not present

## 2017-09-15 DIAGNOSIS — M05419 Rheumatoid myopathy with rheumatoid arthritis of unspecified shoulder: Secondary | ICD-10-CM | POA: Diagnosis not present

## 2017-09-15 DIAGNOSIS — E782 Mixed hyperlipidemia: Secondary | ICD-10-CM | POA: Diagnosis not present

## 2017-09-15 DIAGNOSIS — G35 Multiple sclerosis: Secondary | ICD-10-CM | POA: Diagnosis not present

## 2017-09-15 DIAGNOSIS — M1711 Unilateral primary osteoarthritis, right knee: Secondary | ICD-10-CM | POA: Diagnosis not present

## 2017-09-16 DIAGNOSIS — M5441 Lumbago with sciatica, right side: Secondary | ICD-10-CM | POA: Diagnosis not present

## 2017-09-16 DIAGNOSIS — M5442 Lumbago with sciatica, left side: Secondary | ICD-10-CM | POA: Diagnosis not present

## 2017-09-16 DIAGNOSIS — M79672 Pain in left foot: Secondary | ICD-10-CM | POA: Diagnosis not present

## 2017-09-16 DIAGNOSIS — M7071 Other bursitis of hip, right hip: Secondary | ICD-10-CM | POA: Diagnosis not present

## 2017-09-16 DIAGNOSIS — M7072 Other bursitis of hip, left hip: Secondary | ICD-10-CM | POA: Diagnosis not present

## 2017-09-16 DIAGNOSIS — M130 Polyarthritis, unspecified: Secondary | ICD-10-CM | POA: Diagnosis not present

## 2017-09-16 DIAGNOSIS — G8929 Other chronic pain: Secondary | ICD-10-CM | POA: Diagnosis not present

## 2017-09-16 DIAGNOSIS — M79671 Pain in right foot: Secondary | ICD-10-CM | POA: Diagnosis not present

## 2017-09-16 DIAGNOSIS — E559 Vitamin D deficiency, unspecified: Secondary | ICD-10-CM | POA: Diagnosis not present

## 2017-09-16 DIAGNOSIS — Z79899 Other long term (current) drug therapy: Secondary | ICD-10-CM | POA: Diagnosis not present

## 2017-09-22 DIAGNOSIS — M05419 Rheumatoid myopathy with rheumatoid arthritis of unspecified shoulder: Secondary | ICD-10-CM | POA: Diagnosis not present

## 2017-09-22 DIAGNOSIS — M1711 Unilateral primary osteoarthritis, right knee: Secondary | ICD-10-CM | POA: Diagnosis not present

## 2017-09-22 DIAGNOSIS — M797 Fibromyalgia: Secondary | ICD-10-CM | POA: Diagnosis not present

## 2017-09-22 DIAGNOSIS — G35 Multiple sclerosis: Secondary | ICD-10-CM | POA: Diagnosis not present

## 2017-09-22 DIAGNOSIS — M4807 Spinal stenosis, lumbosacral region: Secondary | ICD-10-CM | POA: Diagnosis not present

## 2017-09-22 DIAGNOSIS — E782 Mixed hyperlipidemia: Secondary | ICD-10-CM | POA: Diagnosis not present

## 2017-09-24 ENCOUNTER — Ambulatory Visit: Payer: Medicare Other | Admitting: Neurology

## 2017-09-24 DIAGNOSIS — F331 Major depressive disorder, recurrent, moderate: Secondary | ICD-10-CM | POA: Diagnosis not present

## 2017-09-28 DIAGNOSIS — R600 Localized edema: Secondary | ICD-10-CM | POA: Diagnosis not present

## 2017-09-28 DIAGNOSIS — I8392 Asymptomatic varicose veins of left lower extremity: Secondary | ICD-10-CM | POA: Diagnosis not present

## 2017-09-28 DIAGNOSIS — I351 Nonrheumatic aortic (valve) insufficiency: Secondary | ICD-10-CM | POA: Diagnosis not present

## 2017-09-28 DIAGNOSIS — M79605 Pain in left leg: Secondary | ICD-10-CM | POA: Diagnosis not present

## 2017-09-28 DIAGNOSIS — I493 Ventricular premature depolarization: Secondary | ICD-10-CM | POA: Diagnosis not present

## 2017-09-28 DIAGNOSIS — I10 Essential (primary) hypertension: Secondary | ICD-10-CM | POA: Diagnosis not present

## 2017-09-28 DIAGNOSIS — M79604 Pain in right leg: Secondary | ICD-10-CM | POA: Diagnosis not present

## 2017-09-29 DIAGNOSIS — M797 Fibromyalgia: Secondary | ICD-10-CM | POA: Diagnosis not present

## 2017-09-29 DIAGNOSIS — M4807 Spinal stenosis, lumbosacral region: Secondary | ICD-10-CM | POA: Diagnosis not present

## 2017-09-29 DIAGNOSIS — M1711 Unilateral primary osteoarthritis, right knee: Secondary | ICD-10-CM | POA: Diagnosis not present

## 2017-09-29 DIAGNOSIS — E782 Mixed hyperlipidemia: Secondary | ICD-10-CM | POA: Diagnosis not present

## 2017-09-29 DIAGNOSIS — M05419 Rheumatoid myopathy with rheumatoid arthritis of unspecified shoulder: Secondary | ICD-10-CM | POA: Diagnosis not present

## 2017-09-29 DIAGNOSIS — G35 Multiple sclerosis: Secondary | ICD-10-CM | POA: Diagnosis not present

## 2017-10-02 DIAGNOSIS — M4807 Spinal stenosis, lumbosacral region: Secondary | ICD-10-CM | POA: Diagnosis not present

## 2017-10-02 DIAGNOSIS — M1711 Unilateral primary osteoarthritis, right knee: Secondary | ICD-10-CM | POA: Diagnosis not present

## 2017-10-02 DIAGNOSIS — M05419 Rheumatoid myopathy with rheumatoid arthritis of unspecified shoulder: Secondary | ICD-10-CM | POA: Diagnosis not present

## 2017-10-02 DIAGNOSIS — G35 Multiple sclerosis: Secondary | ICD-10-CM | POA: Diagnosis not present

## 2017-10-02 DIAGNOSIS — E782 Mixed hyperlipidemia: Secondary | ICD-10-CM | POA: Diagnosis not present

## 2017-10-02 DIAGNOSIS — M797 Fibromyalgia: Secondary | ICD-10-CM | POA: Diagnosis not present

## 2017-10-06 DIAGNOSIS — M4807 Spinal stenosis, lumbosacral region: Secondary | ICD-10-CM | POA: Diagnosis not present

## 2017-10-06 DIAGNOSIS — M1711 Unilateral primary osteoarthritis, right knee: Secondary | ICD-10-CM | POA: Diagnosis not present

## 2017-10-06 DIAGNOSIS — G35 Multiple sclerosis: Secondary | ICD-10-CM | POA: Diagnosis not present

## 2017-10-06 DIAGNOSIS — M05419 Rheumatoid myopathy with rheumatoid arthritis of unspecified shoulder: Secondary | ICD-10-CM | POA: Diagnosis not present

## 2017-10-06 DIAGNOSIS — E782 Mixed hyperlipidemia: Secondary | ICD-10-CM | POA: Diagnosis not present

## 2017-10-06 DIAGNOSIS — M797 Fibromyalgia: Secondary | ICD-10-CM | POA: Diagnosis not present

## 2017-10-08 DIAGNOSIS — M6281 Muscle weakness (generalized): Secondary | ICD-10-CM | POA: Diagnosis not present

## 2017-10-08 DIAGNOSIS — G35 Multiple sclerosis: Secondary | ICD-10-CM | POA: Diagnosis not present

## 2017-10-08 DIAGNOSIS — R39198 Other difficulties with micturition: Secondary | ICD-10-CM | POA: Diagnosis not present

## 2017-10-08 DIAGNOSIS — Z79899 Other long term (current) drug therapy: Secondary | ICD-10-CM | POA: Diagnosis not present

## 2017-10-08 DIAGNOSIS — R269 Unspecified abnormalities of gait and mobility: Secondary | ICD-10-CM | POA: Diagnosis not present

## 2017-10-08 DIAGNOSIS — R350 Frequency of micturition: Secondary | ICD-10-CM | POA: Diagnosis not present

## 2017-10-09 DIAGNOSIS — G35 Multiple sclerosis: Secondary | ICD-10-CM | POA: Diagnosis not present

## 2017-10-09 DIAGNOSIS — M1711 Unilateral primary osteoarthritis, right knee: Secondary | ICD-10-CM | POA: Diagnosis not present

## 2017-10-09 DIAGNOSIS — E782 Mixed hyperlipidemia: Secondary | ICD-10-CM | POA: Diagnosis not present

## 2017-10-09 DIAGNOSIS — M4807 Spinal stenosis, lumbosacral region: Secondary | ICD-10-CM | POA: Diagnosis not present

## 2017-10-09 DIAGNOSIS — M797 Fibromyalgia: Secondary | ICD-10-CM | POA: Diagnosis not present

## 2017-10-09 DIAGNOSIS — M05419 Rheumatoid myopathy with rheumatoid arthritis of unspecified shoulder: Secondary | ICD-10-CM | POA: Diagnosis not present

## 2017-10-13 DIAGNOSIS — R1084 Generalized abdominal pain: Secondary | ICD-10-CM | POA: Diagnosis not present

## 2017-10-14 DIAGNOSIS — M130 Polyarthritis, unspecified: Secondary | ICD-10-CM | POA: Diagnosis not present

## 2017-10-14 DIAGNOSIS — R2689 Other abnormalities of gait and mobility: Secondary | ICD-10-CM | POA: Diagnosis not present

## 2017-10-14 DIAGNOSIS — M25561 Pain in right knee: Secondary | ICD-10-CM | POA: Diagnosis not present

## 2017-10-14 DIAGNOSIS — D649 Anemia, unspecified: Secondary | ICD-10-CM | POA: Diagnosis not present

## 2017-10-16 DIAGNOSIS — M797 Fibromyalgia: Secondary | ICD-10-CM | POA: Diagnosis not present

## 2017-10-16 DIAGNOSIS — M4807 Spinal stenosis, lumbosacral region: Secondary | ICD-10-CM | POA: Diagnosis not present

## 2017-10-16 DIAGNOSIS — E782 Mixed hyperlipidemia: Secondary | ICD-10-CM | POA: Diagnosis not present

## 2017-10-16 DIAGNOSIS — M1711 Unilateral primary osteoarthritis, right knee: Secondary | ICD-10-CM | POA: Diagnosis not present

## 2017-10-16 DIAGNOSIS — G35 Multiple sclerosis: Secondary | ICD-10-CM | POA: Diagnosis not present

## 2017-10-16 DIAGNOSIS — M05419 Rheumatoid myopathy with rheumatoid arthritis of unspecified shoulder: Secondary | ICD-10-CM | POA: Diagnosis not present

## 2017-10-26 DIAGNOSIS — Z886 Allergy status to analgesic agent status: Secondary | ICD-10-CM | POA: Diagnosis not present

## 2017-10-26 DIAGNOSIS — G35 Multiple sclerosis: Secondary | ICD-10-CM | POA: Diagnosis not present

## 2017-10-26 DIAGNOSIS — S134XXA Sprain of ligaments of cervical spine, initial encounter: Secondary | ICD-10-CM | POA: Diagnosis not present

## 2017-10-26 DIAGNOSIS — W0110XA Fall on same level from slipping, tripping and stumbling with subsequent striking against unspecified object, initial encounter: Secondary | ICD-10-CM | POA: Diagnosis not present

## 2017-10-26 DIAGNOSIS — M545 Low back pain: Secondary | ICD-10-CM | POA: Diagnosis not present

## 2017-10-26 DIAGNOSIS — I10 Essential (primary) hypertension: Secondary | ICD-10-CM | POA: Diagnosis not present

## 2017-10-26 DIAGNOSIS — Z882 Allergy status to sulfonamides status: Secondary | ICD-10-CM | POA: Diagnosis not present

## 2017-10-29 ENCOUNTER — Other Ambulatory Visit: Payer: Self-pay | Admitting: Neurology

## 2017-11-03 DIAGNOSIS — K5900 Constipation, unspecified: Secondary | ICD-10-CM | POA: Diagnosis not present

## 2017-11-03 DIAGNOSIS — N319 Neuromuscular dysfunction of bladder, unspecified: Secondary | ICD-10-CM | POA: Diagnosis not present

## 2017-11-03 DIAGNOSIS — R8290 Unspecified abnormal findings in urine: Secondary | ICD-10-CM | POA: Diagnosis not present

## 2017-11-05 ENCOUNTER — Telehealth: Payer: Self-pay | Admitting: *Deleted

## 2017-11-05 NOTE — Telephone Encounter (Signed)
Noted, the patient is now being followed through [facility] neurology for her multiple sclerosis.  She is no longer patient through this office.

## 2017-11-05 NOTE — Telephone Encounter (Signed)
Per Sharmon Leyden in intrafusion, she called pt to remind her about Ocrevus infusion today. Pt stated she told Dr. Jannifer Franklin she was no longer going to do the ocrevus infusions.   I reviewed pt chart and it appears she no showed appt 07/27/17. I checked her appt desk and she cx her f/u with Dr. Jannifer Franklin on 09/24/17 and in notes it stated she was seen elsewhere.   I checked care everywhere and she saw Kai Levins, [place] (neurology) at [facility] on 10/08/17. She was told to f/u with Dr. Moshe Cipro after that in 6 months. I will call her to verify she has transferred care to them.  I called pt and verified she is now following with [facility]. She does not want a f/u with our office and wants her appt cx for ocrevus. She said she called a couple months ago to notify us but advised I did not see phone note. I apologized for this and will make sure Dr. Jannifer Franklin gets this message. I advised her to call in the future if she needs to follow up with [place]. She verbalized understanding and appreciation.  I also notified intrafusion.

## 2017-11-24 DIAGNOSIS — Z6824 Body mass index (BMI) 24.0-24.9, adult: Secondary | ICD-10-CM | POA: Diagnosis not present

## 2017-11-24 DIAGNOSIS — M48061 Spinal stenosis, lumbar region without neurogenic claudication: Secondary | ICD-10-CM | POA: Diagnosis not present

## 2017-11-24 DIAGNOSIS — M4316 Spondylolisthesis, lumbar region: Secondary | ICD-10-CM | POA: Diagnosis not present

## 2017-12-02 DIAGNOSIS — H0012 Chalazion right lower eyelid: Secondary | ICD-10-CM | POA: Diagnosis not present

## 2017-12-09 DIAGNOSIS — M25561 Pain in right knee: Secondary | ICD-10-CM | POA: Diagnosis not present

## 2017-12-09 DIAGNOSIS — D649 Anemia, unspecified: Secondary | ICD-10-CM | POA: Diagnosis not present

## 2017-12-09 DIAGNOSIS — Z23 Encounter for immunization: Secondary | ICD-10-CM | POA: Diagnosis not present

## 2017-12-09 DIAGNOSIS — R2689 Other abnormalities of gait and mobility: Secondary | ICD-10-CM | POA: Diagnosis not present

## 2017-12-09 DIAGNOSIS — M130 Polyarthritis, unspecified: Secondary | ICD-10-CM | POA: Diagnosis not present

## 2017-12-09 DIAGNOSIS — Z79899 Other long term (current) drug therapy: Secondary | ICD-10-CM | POA: Diagnosis not present

## 2017-12-11 DIAGNOSIS — H10521 Angular blepharoconjunctivitis, right eye: Secondary | ICD-10-CM | POA: Diagnosis not present

## 2017-12-24 DIAGNOSIS — R5383 Other fatigue: Secondary | ICD-10-CM | POA: Diagnosis not present

## 2017-12-24 DIAGNOSIS — K921 Melena: Secondary | ICD-10-CM | POA: Diagnosis not present

## 2017-12-24 DIAGNOSIS — E782 Mixed hyperlipidemia: Secondary | ICD-10-CM | POA: Diagnosis not present

## 2017-12-24 DIAGNOSIS — D500 Iron deficiency anemia secondary to blood loss (chronic): Secondary | ICD-10-CM | POA: Diagnosis not present

## 2017-12-24 DIAGNOSIS — M05419 Rheumatoid myopathy with rheumatoid arthritis of unspecified shoulder: Secondary | ICD-10-CM | POA: Diagnosis not present

## 2017-12-24 DIAGNOSIS — I10 Essential (primary) hypertension: Secondary | ICD-10-CM | POA: Diagnosis not present

## 2017-12-30 DIAGNOSIS — Z23 Encounter for immunization: Secondary | ICD-10-CM | POA: Diagnosis not present

## 2017-12-30 DIAGNOSIS — Z0001 Encounter for general adult medical examination with abnormal findings: Secondary | ICD-10-CM | POA: Diagnosis not present

## 2017-12-30 DIAGNOSIS — Z6825 Body mass index (BMI) 25.0-25.9, adult: Secondary | ICD-10-CM | POA: Diagnosis not present

## 2017-12-30 DIAGNOSIS — I10 Essential (primary) hypertension: Secondary | ICD-10-CM | POA: Diagnosis not present

## 2018-01-04 DIAGNOSIS — K130 Diseases of lips: Secondary | ICD-10-CM | POA: Diagnosis not present

## 2018-01-04 DIAGNOSIS — L821 Other seborrheic keratosis: Secondary | ICD-10-CM | POA: Diagnosis not present

## 2018-01-25 DIAGNOSIS — Z1231 Encounter for screening mammogram for malignant neoplasm of breast: Secondary | ICD-10-CM | POA: Diagnosis not present

## 2018-02-24 DIAGNOSIS — M4716 Other spondylosis with myelopathy, lumbar region: Secondary | ICD-10-CM | POA: Diagnosis not present

## 2018-02-24 DIAGNOSIS — Z6825 Body mass index (BMI) 25.0-25.9, adult: Secondary | ICD-10-CM | POA: Diagnosis not present

## 2018-02-24 DIAGNOSIS — R269 Unspecified abnormalities of gait and mobility: Secondary | ICD-10-CM | POA: Diagnosis not present

## 2018-02-25 DIAGNOSIS — M545 Low back pain: Secondary | ICD-10-CM | POA: Diagnosis not present

## 2018-02-25 DIAGNOSIS — I700 Atherosclerosis of aorta: Secondary | ICD-10-CM | POA: Diagnosis not present

## 2018-02-25 DIAGNOSIS — M4716 Other spondylosis with myelopathy, lumbar region: Secondary | ICD-10-CM | POA: Diagnosis not present

## 2018-02-25 DIAGNOSIS — T84226A Displacement of internal fixation device of vertebrae, initial encounter: Secondary | ICD-10-CM | POA: Diagnosis not present

## 2018-03-10 DIAGNOSIS — M545 Low back pain: Secondary | ICD-10-CM | POA: Diagnosis not present

## 2018-03-10 DIAGNOSIS — R2681 Unsteadiness on feet: Secondary | ICD-10-CM | POA: Diagnosis not present

## 2018-03-10 DIAGNOSIS — R262 Difficulty in walking, not elsewhere classified: Secondary | ICD-10-CM | POA: Diagnosis not present

## 2018-03-11 DIAGNOSIS — I10 Essential (primary) hypertension: Secondary | ICD-10-CM | POA: Diagnosis not present

## 2018-03-11 DIAGNOSIS — F331 Major depressive disorder, recurrent, moderate: Secondary | ICD-10-CM | POA: Diagnosis not present

## 2018-03-15 DIAGNOSIS — I10 Essential (primary) hypertension: Secondary | ICD-10-CM | POA: Diagnosis not present

## 2018-03-16 DIAGNOSIS — R2681 Unsteadiness on feet: Secondary | ICD-10-CM | POA: Diagnosis not present

## 2018-03-16 DIAGNOSIS — R262 Difficulty in walking, not elsewhere classified: Secondary | ICD-10-CM | POA: Diagnosis not present

## 2018-03-16 DIAGNOSIS — M545 Low back pain: Secondary | ICD-10-CM | POA: Diagnosis not present

## 2018-03-19 DIAGNOSIS — R2681 Unsteadiness on feet: Secondary | ICD-10-CM | POA: Diagnosis not present

## 2018-03-19 DIAGNOSIS — M545 Low back pain: Secondary | ICD-10-CM | POA: Diagnosis not present

## 2018-03-19 DIAGNOSIS — R262 Difficulty in walking, not elsewhere classified: Secondary | ICD-10-CM | POA: Diagnosis not present

## 2018-03-23 DIAGNOSIS — M545 Low back pain: Secondary | ICD-10-CM | POA: Diagnosis not present

## 2018-03-23 DIAGNOSIS — R2681 Unsteadiness on feet: Secondary | ICD-10-CM | POA: Diagnosis not present

## 2018-03-23 DIAGNOSIS — R262 Difficulty in walking, not elsewhere classified: Secondary | ICD-10-CM | POA: Diagnosis not present

## 2018-03-25 DIAGNOSIS — R262 Difficulty in walking, not elsewhere classified: Secondary | ICD-10-CM | POA: Diagnosis not present

## 2018-03-25 DIAGNOSIS — R2681 Unsteadiness on feet: Secondary | ICD-10-CM | POA: Diagnosis not present

## 2018-03-25 DIAGNOSIS — M545 Low back pain: Secondary | ICD-10-CM | POA: Diagnosis not present

## 2018-03-30 DIAGNOSIS — M1711 Unilateral primary osteoarthritis, right knee: Secondary | ICD-10-CM | POA: Diagnosis not present

## 2018-03-30 DIAGNOSIS — E782 Mixed hyperlipidemia: Secondary | ICD-10-CM | POA: Diagnosis not present

## 2018-03-30 DIAGNOSIS — D649 Anemia, unspecified: Secondary | ICD-10-CM | POA: Diagnosis not present

## 2018-03-30 DIAGNOSIS — F331 Major depressive disorder, recurrent, moderate: Secondary | ICD-10-CM | POA: Diagnosis not present

## 2018-03-30 DIAGNOSIS — D519 Vitamin B12 deficiency anemia, unspecified: Secondary | ICD-10-CM | POA: Diagnosis not present

## 2018-03-30 DIAGNOSIS — Z23 Encounter for immunization: Secondary | ICD-10-CM | POA: Diagnosis not present

## 2018-03-30 DIAGNOSIS — D529 Folate deficiency anemia, unspecified: Secondary | ICD-10-CM | POA: Diagnosis not present

## 2018-03-30 DIAGNOSIS — M05419 Rheumatoid myopathy with rheumatoid arthritis of unspecified shoulder: Secondary | ICD-10-CM | POA: Diagnosis not present

## 2018-03-30 DIAGNOSIS — M797 Fibromyalgia: Secondary | ICD-10-CM | POA: Diagnosis not present

## 2018-03-30 DIAGNOSIS — M545 Low back pain: Secondary | ICD-10-CM | POA: Diagnosis not present

## 2018-03-30 DIAGNOSIS — D500 Iron deficiency anemia secondary to blood loss (chronic): Secondary | ICD-10-CM | POA: Diagnosis not present

## 2018-03-30 DIAGNOSIS — M810 Age-related osteoporosis without current pathological fracture: Secondary | ICD-10-CM | POA: Diagnosis not present

## 2018-03-30 DIAGNOSIS — G35 Multiple sclerosis: Secondary | ICD-10-CM | POA: Diagnosis not present

## 2018-03-30 DIAGNOSIS — I10 Essential (primary) hypertension: Secondary | ICD-10-CM | POA: Diagnosis not present

## 2018-03-30 DIAGNOSIS — Z6825 Body mass index (BMI) 25.0-25.9, adult: Secondary | ICD-10-CM | POA: Diagnosis not present

## 2018-04-02 DIAGNOSIS — M542 Cervicalgia: Secondary | ICD-10-CM | POA: Diagnosis not present

## 2018-04-02 DIAGNOSIS — M7071 Other bursitis of hip, right hip: Secondary | ICD-10-CM | POA: Diagnosis not present

## 2018-04-02 DIAGNOSIS — M130 Polyarthritis, unspecified: Secondary | ICD-10-CM | POA: Diagnosis not present

## 2018-04-02 DIAGNOSIS — M7072 Other bursitis of hip, left hip: Secondary | ICD-10-CM | POA: Diagnosis not present

## 2018-04-09 DIAGNOSIS — M25561 Pain in right knee: Secondary | ICD-10-CM | POA: Diagnosis not present

## 2018-04-09 DIAGNOSIS — Z79899 Other long term (current) drug therapy: Secondary | ICD-10-CM | POA: Diagnosis not present

## 2018-04-09 DIAGNOSIS — M130 Polyarthritis, unspecified: Secondary | ICD-10-CM | POA: Diagnosis not present

## 2018-04-09 DIAGNOSIS — E559 Vitamin D deficiency, unspecified: Secondary | ICD-10-CM | POA: Diagnosis not present

## 2018-04-09 DIAGNOSIS — M542 Cervicalgia: Secondary | ICD-10-CM | POA: Diagnosis not present

## 2018-04-09 DIAGNOSIS — D649 Anemia, unspecified: Secondary | ICD-10-CM | POA: Diagnosis not present

## 2018-04-16 DIAGNOSIS — I10 Essential (primary) hypertension: Secondary | ICD-10-CM | POA: Diagnosis not present

## 2018-04-16 DIAGNOSIS — R600 Localized edema: Secondary | ICD-10-CM | POA: Diagnosis not present

## 2018-04-16 DIAGNOSIS — I8392 Asymptomatic varicose veins of left lower extremity: Secondary | ICD-10-CM | POA: Diagnosis not present

## 2018-04-16 DIAGNOSIS — M79605 Pain in left leg: Secondary | ICD-10-CM | POA: Diagnosis not present

## 2018-05-06 DIAGNOSIS — D500 Iron deficiency anemia secondary to blood loss (chronic): Secondary | ICD-10-CM | POA: Diagnosis not present

## 2018-05-06 DIAGNOSIS — I10 Essential (primary) hypertension: Secondary | ICD-10-CM | POA: Diagnosis not present

## 2018-05-06 DIAGNOSIS — M810 Age-related osteoporosis without current pathological fracture: Secondary | ICD-10-CM | POA: Diagnosis not present

## 2018-05-06 DIAGNOSIS — E782 Mixed hyperlipidemia: Secondary | ICD-10-CM | POA: Diagnosis not present

## 2018-05-06 DIAGNOSIS — M05419 Rheumatoid myopathy with rheumatoid arthritis of unspecified shoulder: Secondary | ICD-10-CM | POA: Diagnosis not present

## 2018-05-06 DIAGNOSIS — M545 Low back pain: Secondary | ICD-10-CM | POA: Diagnosis not present

## 2018-05-06 DIAGNOSIS — G35 Multiple sclerosis: Secondary | ICD-10-CM | POA: Diagnosis not present

## 2018-05-06 DIAGNOSIS — M1711 Unilateral primary osteoarthritis, right knee: Secondary | ICD-10-CM | POA: Diagnosis not present

## 2018-06-08 DIAGNOSIS — M542 Cervicalgia: Secondary | ICD-10-CM | POA: Diagnosis not present

## 2018-06-08 DIAGNOSIS — M25561 Pain in right knee: Secondary | ICD-10-CM | POA: Diagnosis not present

## 2018-06-08 DIAGNOSIS — M130 Polyarthritis, unspecified: Secondary | ICD-10-CM | POA: Diagnosis not present

## 2018-06-08 DIAGNOSIS — D649 Anemia, unspecified: Secondary | ICD-10-CM | POA: Diagnosis not present

## 2018-06-25 DIAGNOSIS — M1711 Unilateral primary osteoarthritis, right knee: Secondary | ICD-10-CM | POA: Diagnosis not present

## 2018-06-25 DIAGNOSIS — G252 Other specified forms of tremor: Secondary | ICD-10-CM | POA: Diagnosis not present

## 2018-06-25 DIAGNOSIS — I10 Essential (primary) hypertension: Secondary | ICD-10-CM | POA: Diagnosis not present

## 2018-06-25 DIAGNOSIS — G35 Multiple sclerosis: Secondary | ICD-10-CM | POA: Diagnosis not present

## 2018-06-25 DIAGNOSIS — D500 Iron deficiency anemia secondary to blood loss (chronic): Secondary | ICD-10-CM | POA: Diagnosis not present

## 2018-06-25 DIAGNOSIS — M05419 Rheumatoid myopathy with rheumatoid arthritis of unspecified shoulder: Secondary | ICD-10-CM | POA: Diagnosis not present

## 2018-06-25 DIAGNOSIS — F331 Major depressive disorder, recurrent, moderate: Secondary | ICD-10-CM | POA: Diagnosis not present

## 2018-06-25 DIAGNOSIS — E782 Mixed hyperlipidemia: Secondary | ICD-10-CM | POA: Diagnosis not present

## 2018-06-30 DIAGNOSIS — M5090 Cervical disc disorder, unspecified, unspecified cervical region: Secondary | ICD-10-CM | POA: Diagnosis not present

## 2018-07-07 DIAGNOSIS — N3946 Mixed incontinence: Secondary | ICD-10-CM | POA: Diagnosis not present

## 2018-07-09 DIAGNOSIS — M542 Cervicalgia: Secondary | ICD-10-CM | POA: Diagnosis not present

## 2018-07-09 DIAGNOSIS — M5090 Cervical disc disorder, unspecified, unspecified cervical region: Secondary | ICD-10-CM | POA: Diagnosis not present

## 2018-07-12 DIAGNOSIS — M5090 Cervical disc disorder, unspecified, unspecified cervical region: Secondary | ICD-10-CM | POA: Diagnosis not present

## 2018-07-12 DIAGNOSIS — M542 Cervicalgia: Secondary | ICD-10-CM | POA: Diagnosis not present

## 2018-07-16 DIAGNOSIS — M542 Cervicalgia: Secondary | ICD-10-CM | POA: Diagnosis not present

## 2018-07-16 DIAGNOSIS — M5090 Cervical disc disorder, unspecified, unspecified cervical region: Secondary | ICD-10-CM | POA: Diagnosis not present

## 2018-07-19 DIAGNOSIS — M5090 Cervical disc disorder, unspecified, unspecified cervical region: Secondary | ICD-10-CM | POA: Diagnosis not present

## 2018-07-19 DIAGNOSIS — M542 Cervicalgia: Secondary | ICD-10-CM | POA: Diagnosis not present

## 2018-07-21 DIAGNOSIS — G8929 Other chronic pain: Secondary | ICD-10-CM | POA: Diagnosis not present

## 2018-07-21 DIAGNOSIS — M5412 Radiculopathy, cervical region: Secondary | ICD-10-CM | POA: Diagnosis not present

## 2018-07-21 DIAGNOSIS — M542 Cervicalgia: Secondary | ICD-10-CM | POA: Diagnosis not present

## 2018-07-21 DIAGNOSIS — M5090 Cervical disc disorder, unspecified, unspecified cervical region: Secondary | ICD-10-CM | POA: Diagnosis not present

## 2018-07-21 DIAGNOSIS — M47812 Spondylosis without myelopathy or radiculopathy, cervical region: Secondary | ICD-10-CM | POA: Diagnosis not present

## 2018-07-23 DIAGNOSIS — M542 Cervicalgia: Secondary | ICD-10-CM | POA: Diagnosis not present

## 2018-07-23 DIAGNOSIS — M5090 Cervical disc disorder, unspecified, unspecified cervical region: Secondary | ICD-10-CM | POA: Diagnosis not present

## 2018-08-02 DIAGNOSIS — M5090 Cervical disc disorder, unspecified, unspecified cervical region: Secondary | ICD-10-CM | POA: Diagnosis not present

## 2018-08-02 DIAGNOSIS — M542 Cervicalgia: Secondary | ICD-10-CM | POA: Diagnosis not present

## 2018-08-05 DIAGNOSIS — M5090 Cervical disc disorder, unspecified, unspecified cervical region: Secondary | ICD-10-CM | POA: Diagnosis not present

## 2018-08-05 DIAGNOSIS — R269 Unspecified abnormalities of gait and mobility: Secondary | ICD-10-CM | POA: Diagnosis not present

## 2018-08-05 DIAGNOSIS — G8929 Other chronic pain: Secondary | ICD-10-CM | POA: Diagnosis not present

## 2018-08-05 DIAGNOSIS — M542 Cervicalgia: Secondary | ICD-10-CM | POA: Diagnosis not present

## 2018-08-10 DIAGNOSIS — G35 Multiple sclerosis: Secondary | ICD-10-CM | POA: Diagnosis not present

## 2018-08-10 DIAGNOSIS — E782 Mixed hyperlipidemia: Secondary | ICD-10-CM | POA: Diagnosis not present

## 2018-08-10 DIAGNOSIS — F331 Major depressive disorder, recurrent, moderate: Secondary | ICD-10-CM | POA: Diagnosis not present

## 2018-08-17 DIAGNOSIS — N3946 Mixed incontinence: Secondary | ICD-10-CM | POA: Diagnosis not present

## 2018-08-27 DIAGNOSIS — M47812 Spondylosis without myelopathy or radiculopathy, cervical region: Secondary | ICD-10-CM | POA: Diagnosis not present

## 2018-08-30 DIAGNOSIS — N3946 Mixed incontinence: Secondary | ICD-10-CM | POA: Diagnosis not present

## 2018-09-03 DIAGNOSIS — M130 Polyarthritis, unspecified: Secondary | ICD-10-CM | POA: Diagnosis not present

## 2018-09-03 DIAGNOSIS — M542 Cervicalgia: Secondary | ICD-10-CM | POA: Diagnosis not present

## 2018-09-03 DIAGNOSIS — D649 Anemia, unspecified: Secondary | ICD-10-CM | POA: Diagnosis not present

## 2018-09-03 DIAGNOSIS — M25561 Pain in right knee: Secondary | ICD-10-CM | POA: Diagnosis not present

## 2018-09-06 DIAGNOSIS — M7072 Other bursitis of hip, left hip: Secondary | ICD-10-CM | POA: Diagnosis not present

## 2018-09-06 DIAGNOSIS — E559 Vitamin D deficiency, unspecified: Secondary | ICD-10-CM | POA: Diagnosis not present

## 2018-09-06 DIAGNOSIS — M79642 Pain in left hand: Secondary | ICD-10-CM | POA: Diagnosis not present

## 2018-09-06 DIAGNOSIS — M79641 Pain in right hand: Secondary | ICD-10-CM | POA: Diagnosis not present

## 2018-09-06 DIAGNOSIS — Z79899 Other long term (current) drug therapy: Secondary | ICD-10-CM | POA: Diagnosis not present

## 2018-09-06 DIAGNOSIS — M7071 Other bursitis of hip, right hip: Secondary | ICD-10-CM | POA: Diagnosis not present

## 2018-09-06 DIAGNOSIS — M5440 Lumbago with sciatica, unspecified side: Secondary | ICD-10-CM | POA: Diagnosis not present

## 2018-09-06 DIAGNOSIS — M130 Polyarthritis, unspecified: Secondary | ICD-10-CM | POA: Diagnosis not present

## 2018-09-10 DIAGNOSIS — E782 Mixed hyperlipidemia: Secondary | ICD-10-CM | POA: Diagnosis not present

## 2018-09-10 DIAGNOSIS — I10 Essential (primary) hypertension: Secondary | ICD-10-CM | POA: Diagnosis not present

## 2018-09-21 DIAGNOSIS — G35 Multiple sclerosis: Secondary | ICD-10-CM | POA: Diagnosis not present

## 2018-09-21 DIAGNOSIS — D519 Vitamin B12 deficiency anemia, unspecified: Secondary | ICD-10-CM | POA: Diagnosis not present

## 2018-09-21 DIAGNOSIS — E782 Mixed hyperlipidemia: Secondary | ICD-10-CM | POA: Diagnosis not present

## 2018-09-21 DIAGNOSIS — D649 Anemia, unspecified: Secondary | ICD-10-CM | POA: Diagnosis not present

## 2018-09-21 DIAGNOSIS — M797 Fibromyalgia: Secondary | ICD-10-CM | POA: Diagnosis not present

## 2018-09-21 DIAGNOSIS — F331 Major depressive disorder, recurrent, moderate: Secondary | ICD-10-CM | POA: Diagnosis not present

## 2018-09-21 DIAGNOSIS — D529 Folate deficiency anemia, unspecified: Secondary | ICD-10-CM | POA: Diagnosis not present

## 2018-09-21 DIAGNOSIS — I10 Essential (primary) hypertension: Secondary | ICD-10-CM | POA: Diagnosis not present

## 2018-09-27 DIAGNOSIS — R29898 Other symptoms and signs involving the musculoskeletal system: Secondary | ICD-10-CM | POA: Diagnosis not present

## 2018-09-27 DIAGNOSIS — M542 Cervicalgia: Secondary | ICD-10-CM | POA: Diagnosis not present

## 2018-09-27 DIAGNOSIS — R269 Unspecified abnormalities of gait and mobility: Secondary | ICD-10-CM | POA: Diagnosis not present

## 2018-09-27 DIAGNOSIS — M5090 Cervical disc disorder, unspecified, unspecified cervical region: Secondary | ICD-10-CM | POA: Diagnosis not present

## 2018-10-11 DIAGNOSIS — E782 Mixed hyperlipidemia: Secondary | ICD-10-CM | POA: Diagnosis not present

## 2018-10-11 DIAGNOSIS — I10 Essential (primary) hypertension: Secondary | ICD-10-CM | POA: Diagnosis not present

## 2018-10-13 DIAGNOSIS — R269 Unspecified abnormalities of gait and mobility: Secondary | ICD-10-CM | POA: Diagnosis not present

## 2018-10-13 DIAGNOSIS — R531 Weakness: Secondary | ICD-10-CM | POA: Diagnosis not present

## 2018-10-13 DIAGNOSIS — R29898 Other symptoms and signs involving the musculoskeletal system: Secondary | ICD-10-CM | POA: Diagnosis not present

## 2018-10-15 DIAGNOSIS — R269 Unspecified abnormalities of gait and mobility: Secondary | ICD-10-CM | POA: Diagnosis not present

## 2018-10-15 DIAGNOSIS — R531 Weakness: Secondary | ICD-10-CM | POA: Diagnosis not present

## 2018-10-15 DIAGNOSIS — R29898 Other symptoms and signs involving the musculoskeletal system: Secondary | ICD-10-CM | POA: Diagnosis not present

## 2018-10-20 DIAGNOSIS — R29898 Other symptoms and signs involving the musculoskeletal system: Secondary | ICD-10-CM | POA: Diagnosis not present

## 2018-10-20 DIAGNOSIS — R269 Unspecified abnormalities of gait and mobility: Secondary | ICD-10-CM | POA: Diagnosis not present

## 2018-10-20 DIAGNOSIS — R531 Weakness: Secondary | ICD-10-CM | POA: Diagnosis not present

## 2018-11-02 DIAGNOSIS — Z23 Encounter for immunization: Secondary | ICD-10-CM | POA: Diagnosis not present

## 2018-11-02 DIAGNOSIS — M545 Low back pain: Secondary | ICD-10-CM | POA: Diagnosis not present

## 2018-11-02 DIAGNOSIS — M797 Fibromyalgia: Secondary | ICD-10-CM | POA: Diagnosis not present

## 2018-11-02 DIAGNOSIS — I10 Essential (primary) hypertension: Secondary | ICD-10-CM | POA: Diagnosis not present

## 2018-11-02 DIAGNOSIS — D529 Folate deficiency anemia, unspecified: Secondary | ICD-10-CM | POA: Diagnosis not present

## 2018-11-02 DIAGNOSIS — Z9189 Other specified personal risk factors, not elsewhere classified: Secondary | ICD-10-CM | POA: Diagnosis not present

## 2018-11-02 DIAGNOSIS — M05419 Rheumatoid myopathy with rheumatoid arthritis of unspecified shoulder: Secondary | ICD-10-CM | POA: Diagnosis not present

## 2018-11-02 DIAGNOSIS — D519 Vitamin B12 deficiency anemia, unspecified: Secondary | ICD-10-CM | POA: Diagnosis not present

## 2018-11-02 DIAGNOSIS — M810 Age-related osteoporosis without current pathological fracture: Secondary | ICD-10-CM | POA: Diagnosis not present

## 2018-11-02 DIAGNOSIS — Z6823 Body mass index (BMI) 23.0-23.9, adult: Secondary | ICD-10-CM | POA: Diagnosis not present

## 2018-11-02 DIAGNOSIS — E782 Mixed hyperlipidemia: Secondary | ICD-10-CM | POA: Diagnosis not present

## 2018-11-02 DIAGNOSIS — F331 Major depressive disorder, recurrent, moderate: Secondary | ICD-10-CM | POA: Diagnosis not present

## 2018-11-02 DIAGNOSIS — M7551 Bursitis of right shoulder: Secondary | ICD-10-CM | POA: Diagnosis not present

## 2018-11-02 DIAGNOSIS — G252 Other specified forms of tremor: Secondary | ICD-10-CM | POA: Diagnosis not present

## 2018-11-02 DIAGNOSIS — D500 Iron deficiency anemia secondary to blood loss (chronic): Secondary | ICD-10-CM | POA: Diagnosis not present

## 2018-11-02 DIAGNOSIS — G35 Multiple sclerosis: Secondary | ICD-10-CM | POA: Diagnosis not present

## 2018-11-09 DIAGNOSIS — E611 Iron deficiency: Secondary | ICD-10-CM | POA: Diagnosis not present

## 2018-11-10 DIAGNOSIS — I10 Essential (primary) hypertension: Secondary | ICD-10-CM | POA: Diagnosis not present

## 2018-11-10 DIAGNOSIS — E782 Mixed hyperlipidemia: Secondary | ICD-10-CM | POA: Diagnosis not present

## 2018-11-16 DIAGNOSIS — E611 Iron deficiency: Secondary | ICD-10-CM | POA: Diagnosis not present

## 2018-12-10 DIAGNOSIS — G35 Multiple sclerosis: Secondary | ICD-10-CM | POA: Diagnosis not present

## 2018-12-10 DIAGNOSIS — I10 Essential (primary) hypertension: Secondary | ICD-10-CM | POA: Diagnosis not present

## 2018-12-18 IMAGING — CR DG KNEE 1-2V*L*
2 series · 2 of 2 positions shown · non-contrast
Comparison: None.

CLINICAL DATA: Pain following fall

EXAM:
LEFT KNEE - 1-2 VIEW

[w knee lat left]
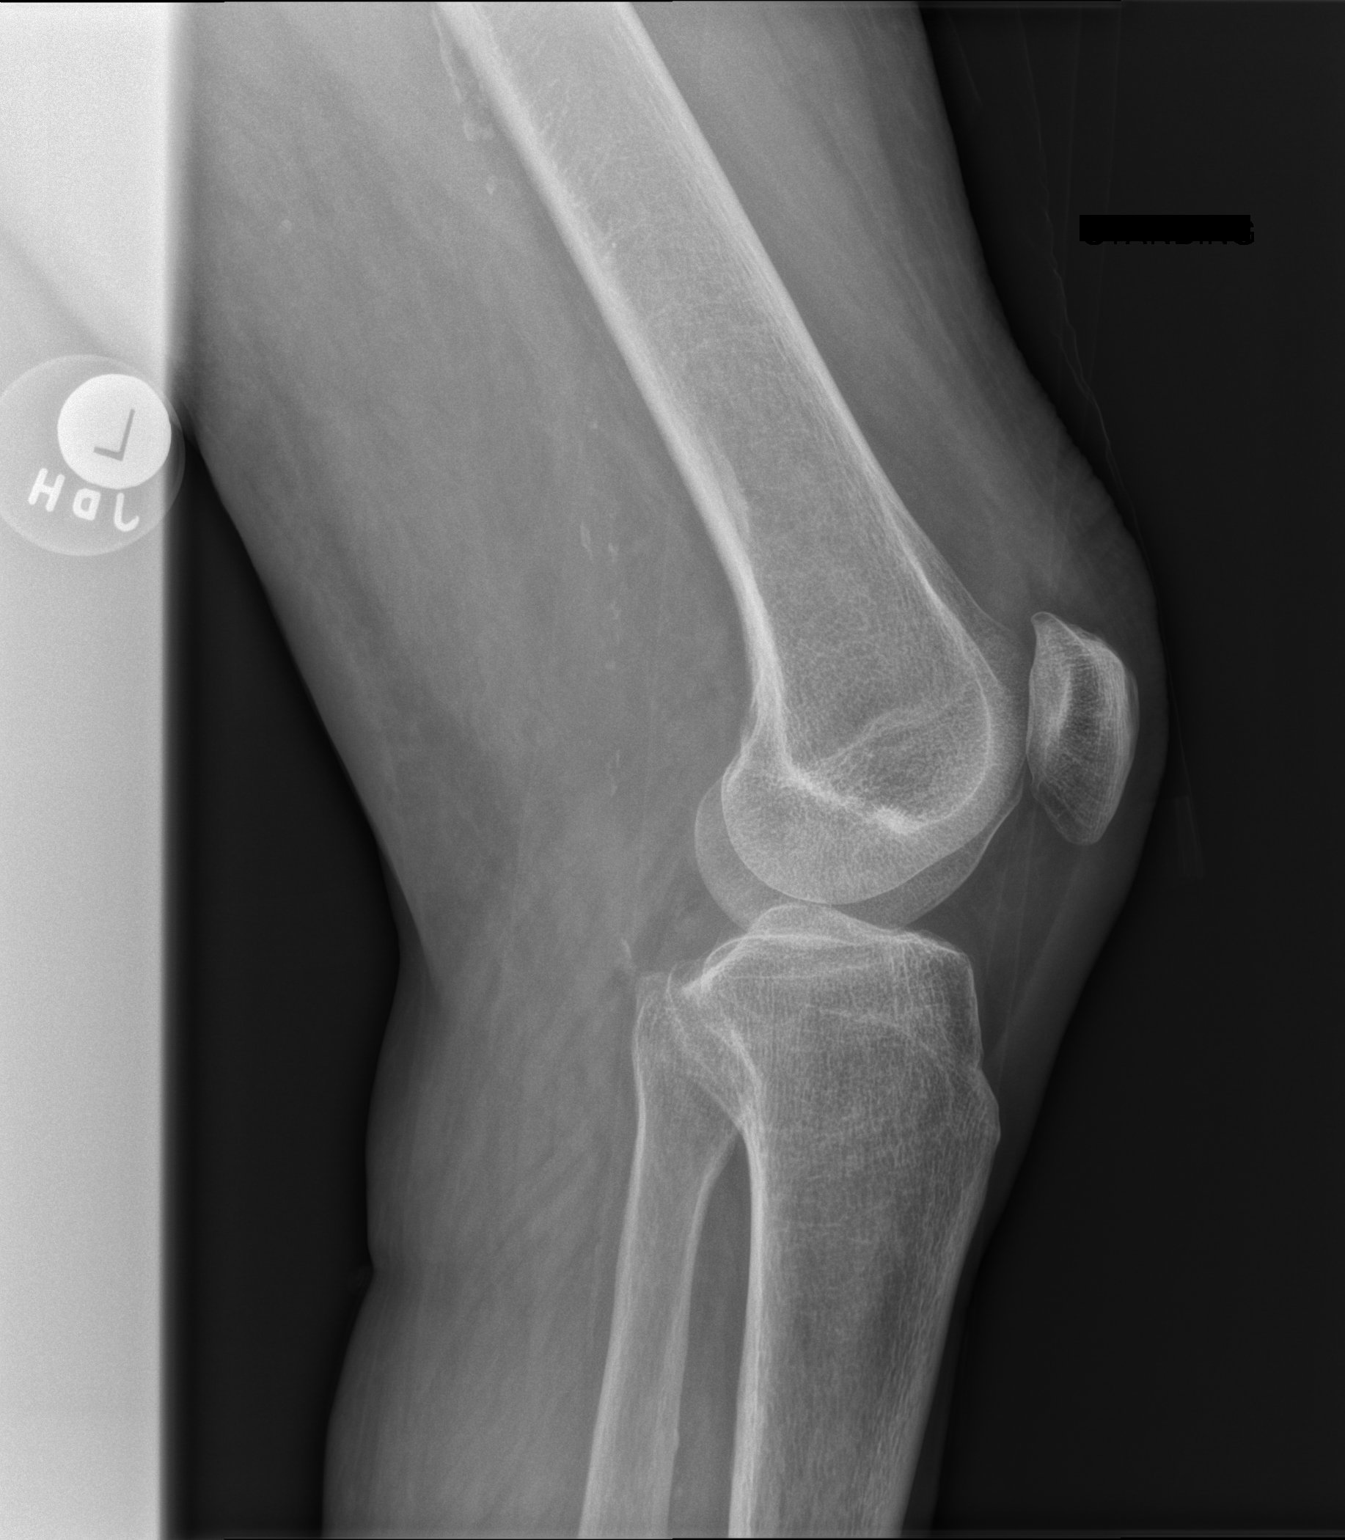

[w knee ap left]
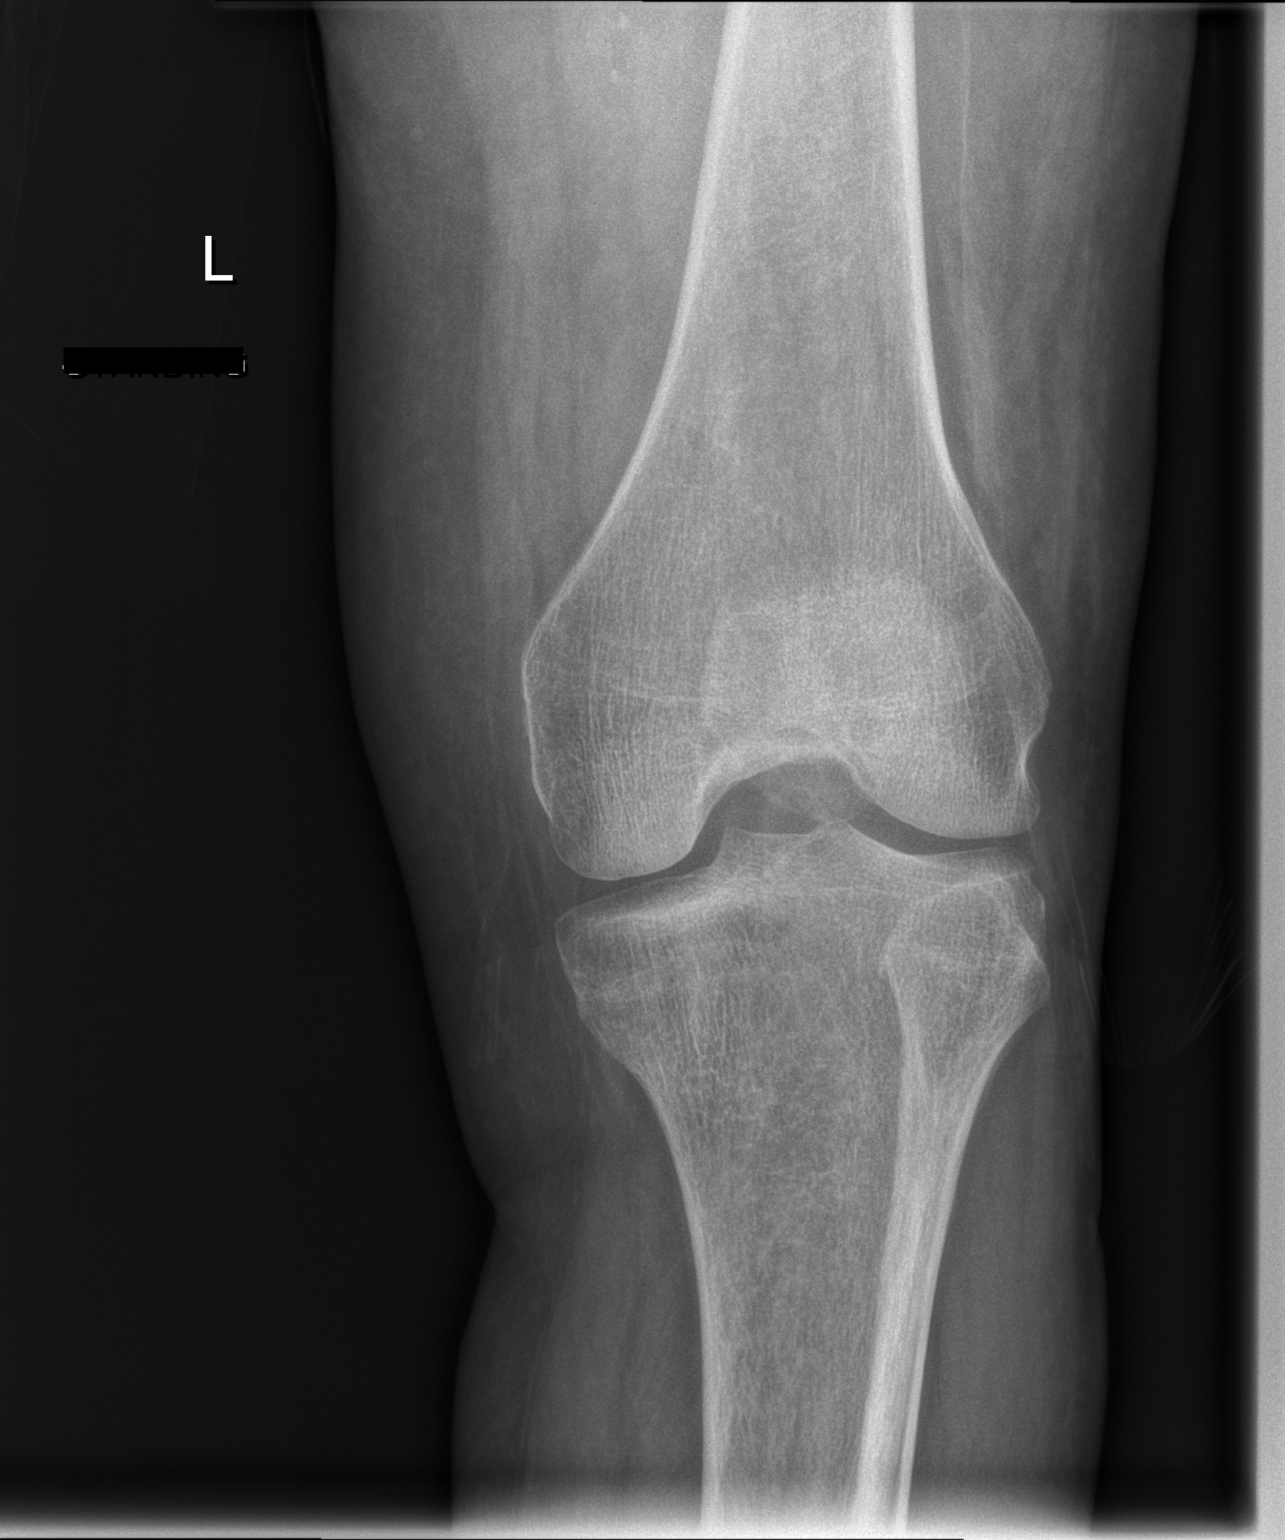

[2 of 2 positions shown; findings below may reference images not displayed]

FINDINGS: Standing frontal and standing lateral views obtained. No fracture or
dislocation. No joint effusion. The joint spaces appear normal. No
erosive change. There is atherosclerotic calcification in the distal
superficial femoral and popliteal arteries.
IMPRESSION: No fracture or dislocation. No joint effusion. No appreciable
arthropathy. There is vascular atherosclerosis in the popliteal and
distal superficial femoral arteries.

## 2018-12-18 IMAGING — CR DG LUMBAR SPINE 2-3V
3 series · 3 of 3 positions shown · non-contrast
Comparison: 01/30/2017

CLINICAL DATA: Back surgery 6 weeks ago, recent falls, history
multiple sclerosis, question of hardware stability post falls

EXAM:
LUMBAR SPINE - 2-3 VIEW

[w lumbar spine ap]
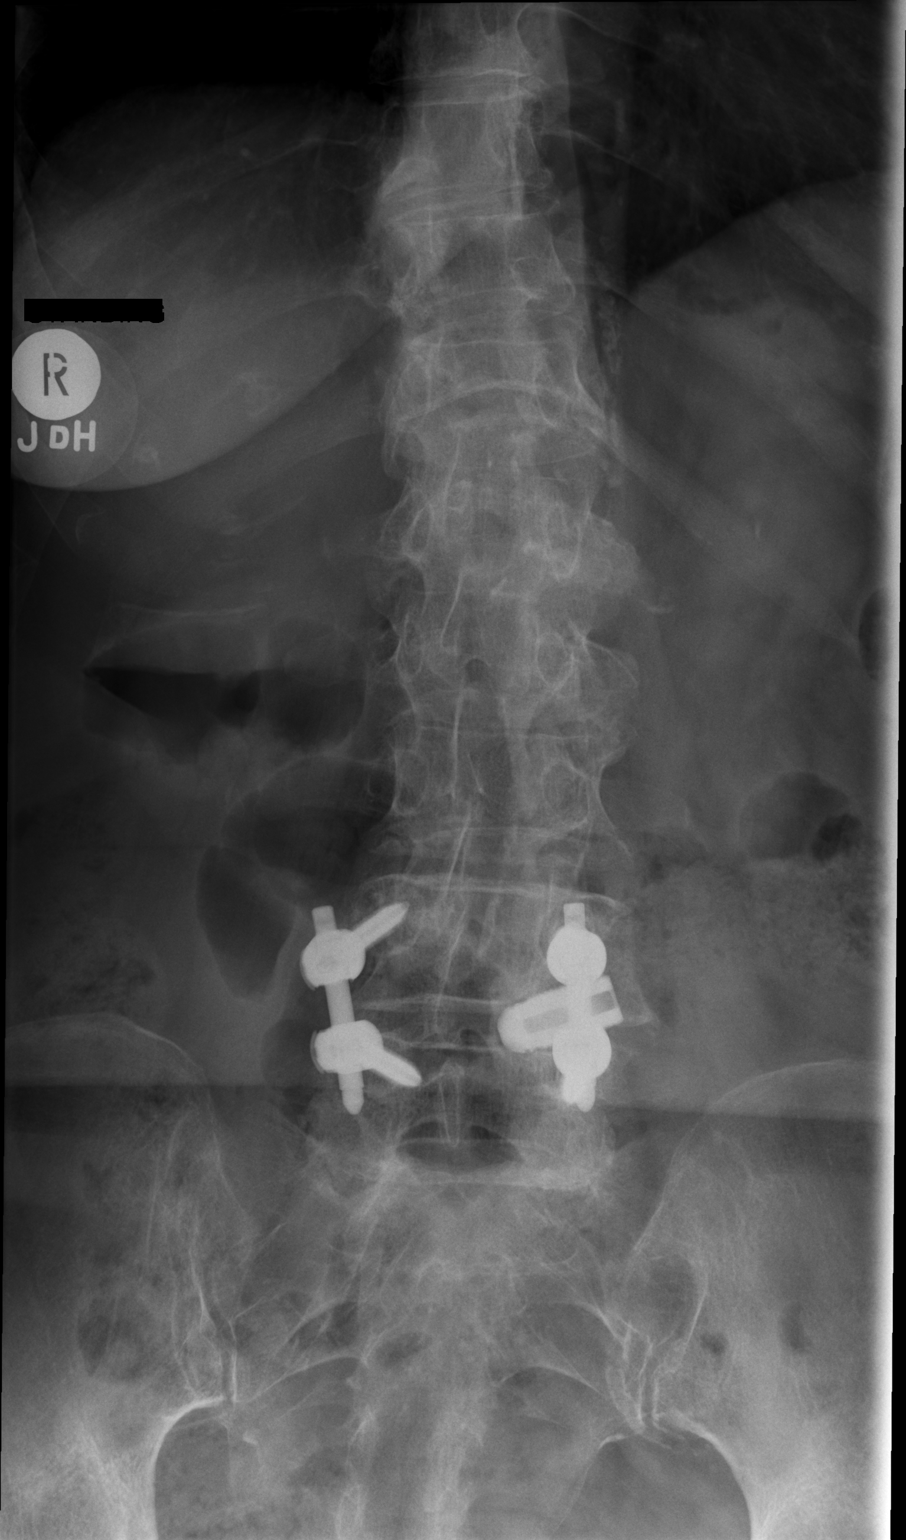

[w lumbar spine lat]
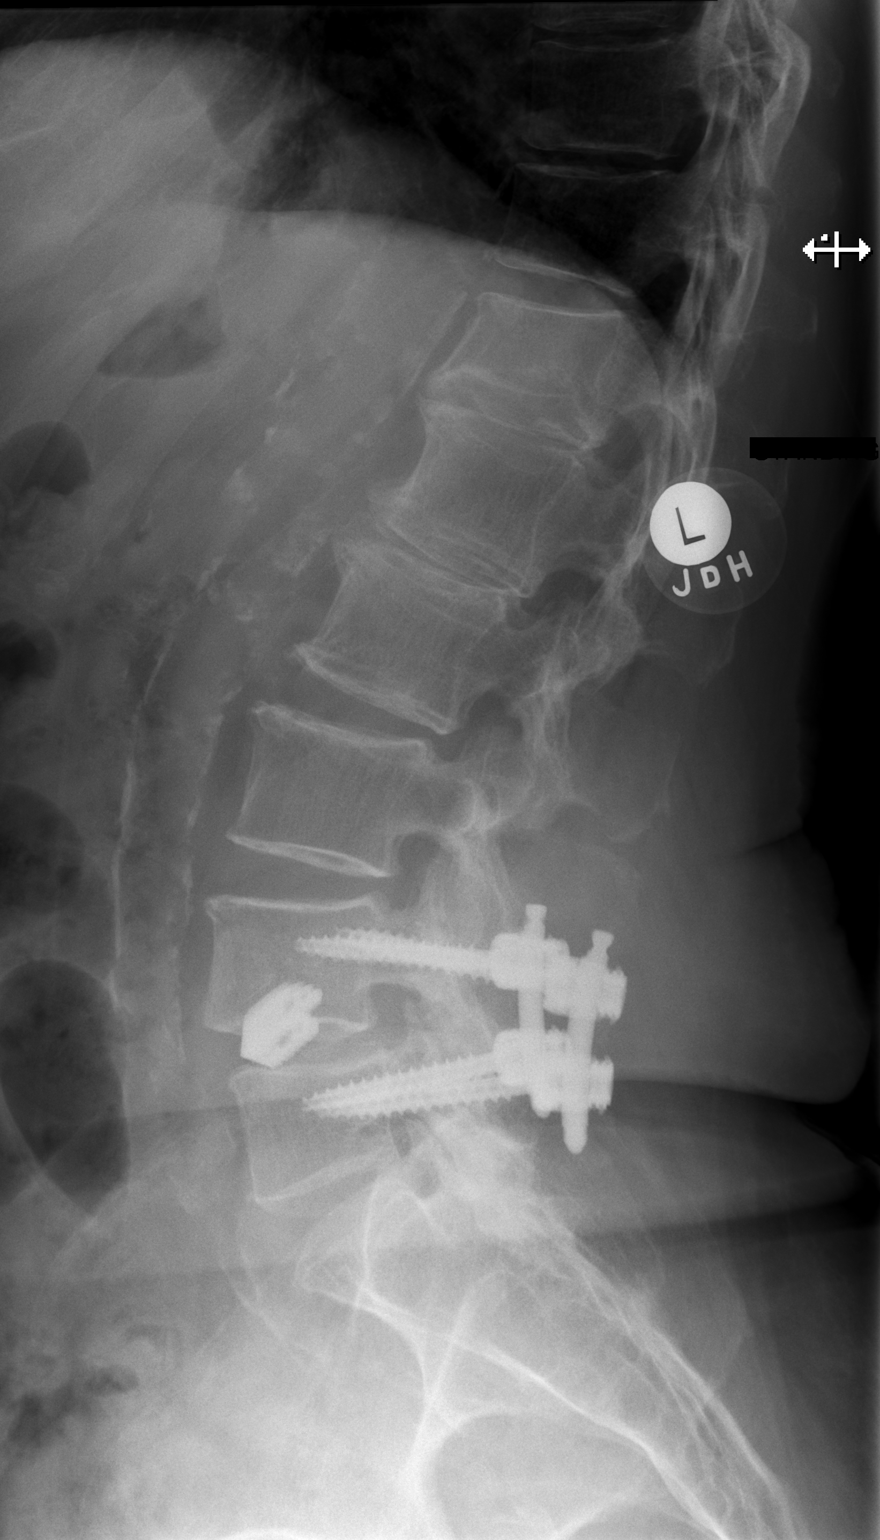

[w lumbar l-5 s-1 spot]
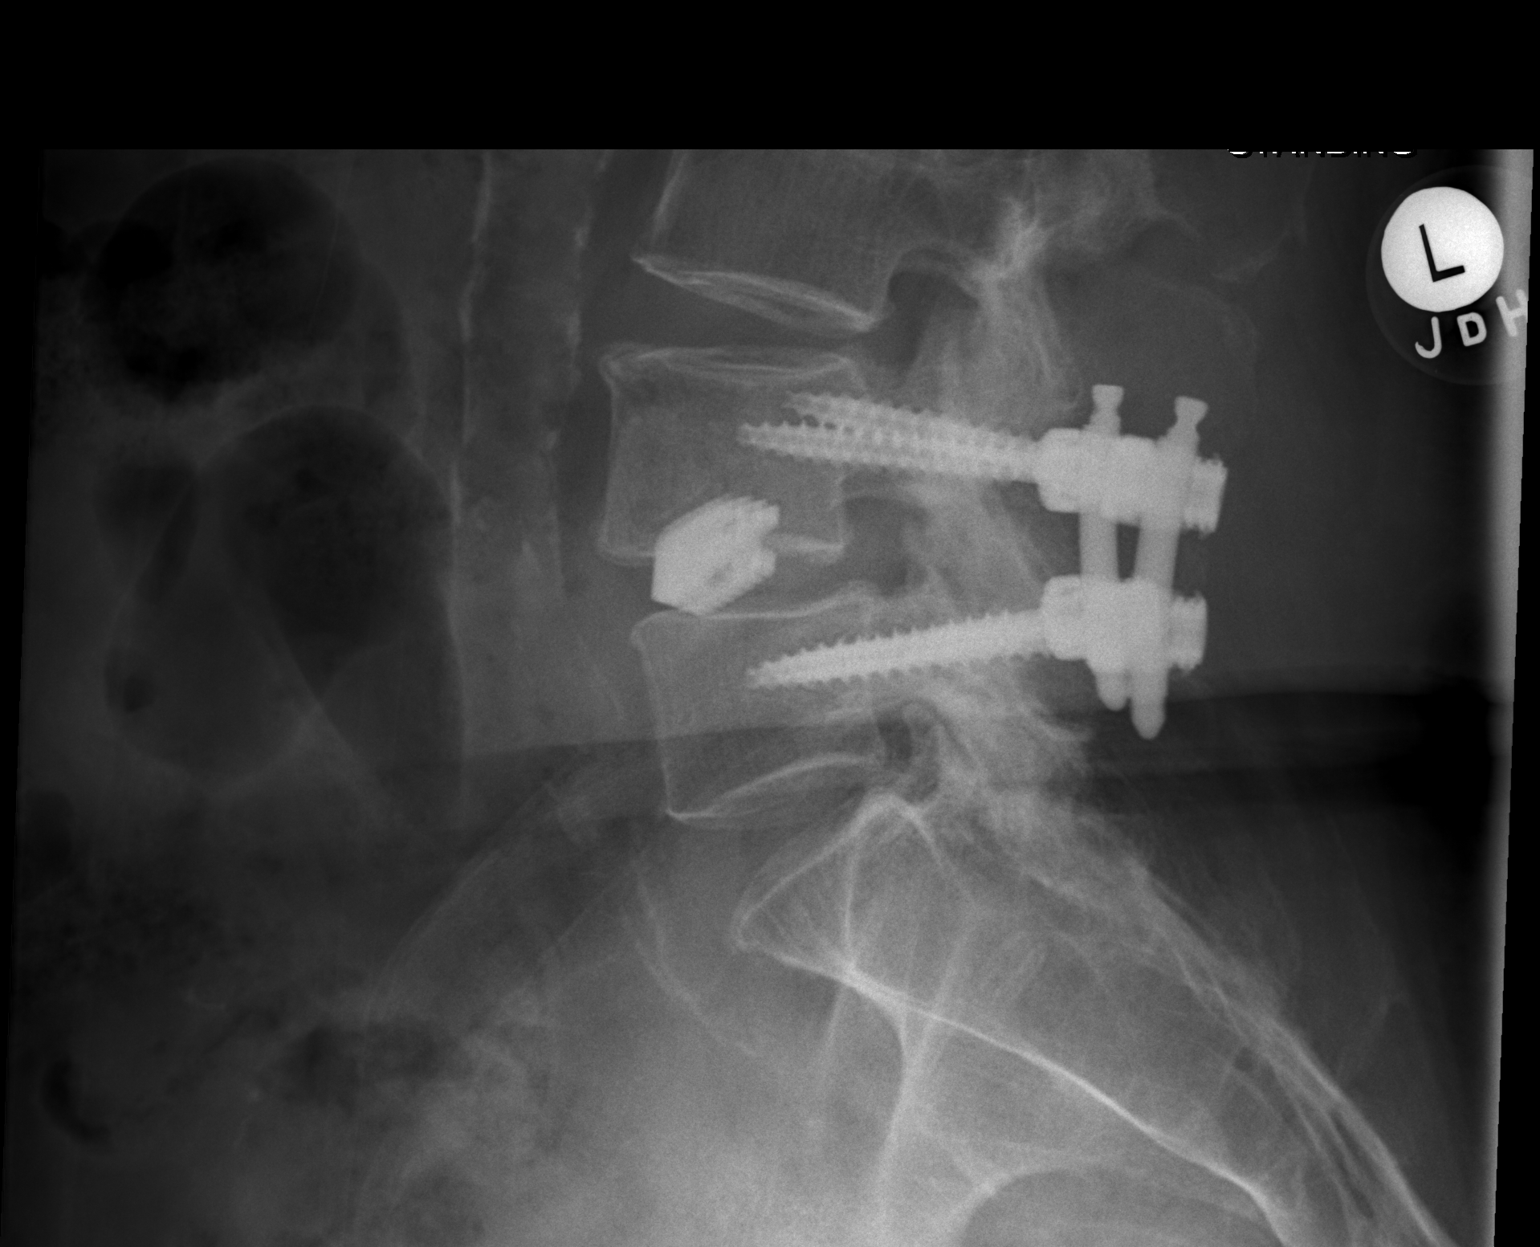

[3 of 3 positions shown; findings below may reference images not displayed]

FINDINGS: Diffuse osseous demineralization.

Five non-rib-bearing lumbar vertebra.

BILATERAL pedicle screws and posterior bars at L4-L5.

Disc prosthesis at L4-L5 disc space to the LEFT.

Hardware intact.

Deformity of the inferior endplate of T12 again seen, stable.

No acute fracture, subluxation or bone destruction.

Mild levoconvex thoracolumbar scoliosis.

Atherosclerotic calcifications aorta.
IMPRESSION: Postsurgical changes of L4-L5 posterior fusion.

Osseous demineralization with degenerative changes and mild
levoconvex scoliosis.

No acute abnormalities.

## 2019-01-01 DIAGNOSIS — R1010 Upper abdominal pain, unspecified: Secondary | ICD-10-CM | POA: Diagnosis not present

## 2019-01-01 DIAGNOSIS — Z886 Allergy status to analgesic agent status: Secondary | ICD-10-CM | POA: Diagnosis not present

## 2019-01-01 DIAGNOSIS — Z5320 Procedure and treatment not carried out because of patient's decision for unspecified reasons: Secondary | ICD-10-CM | POA: Diagnosis not present

## 2019-01-01 DIAGNOSIS — Z882 Allergy status to sulfonamides status: Secondary | ICD-10-CM | POA: Diagnosis not present

## 2019-01-01 DIAGNOSIS — R079 Chest pain, unspecified: Secondary | ICD-10-CM | POA: Diagnosis not present

## 2019-01-02 DIAGNOSIS — R0789 Other chest pain: Secondary | ICD-10-CM | POA: Diagnosis not present

## 2019-01-10 DIAGNOSIS — G35 Multiple sclerosis: Secondary | ICD-10-CM | POA: Diagnosis not present

## 2019-01-10 DIAGNOSIS — I10 Essential (primary) hypertension: Secondary | ICD-10-CM | POA: Diagnosis not present

## 2019-01-12 DIAGNOSIS — E782 Mixed hyperlipidemia: Secondary | ICD-10-CM | POA: Diagnosis not present

## 2019-01-12 DIAGNOSIS — M05419 Rheumatoid myopathy with rheumatoid arthritis of unspecified shoulder: Secondary | ICD-10-CM | POA: Diagnosis not present

## 2019-01-12 DIAGNOSIS — M810 Age-related osteoporosis without current pathological fracture: Secondary | ICD-10-CM | POA: Diagnosis not present

## 2019-01-12 DIAGNOSIS — G35 Multiple sclerosis: Secondary | ICD-10-CM | POA: Diagnosis not present

## 2019-01-12 DIAGNOSIS — M1711 Unilateral primary osteoarthritis, right knee: Secondary | ICD-10-CM | POA: Diagnosis not present

## 2019-01-12 DIAGNOSIS — D500 Iron deficiency anemia secondary to blood loss (chronic): Secondary | ICD-10-CM | POA: Diagnosis not present

## 2019-01-12 DIAGNOSIS — Z6824 Body mass index (BMI) 24.0-24.9, adult: Secondary | ICD-10-CM | POA: Diagnosis not present

## 2019-01-12 DIAGNOSIS — I10 Essential (primary) hypertension: Secondary | ICD-10-CM | POA: Diagnosis not present

## 2019-01-14 DIAGNOSIS — M549 Dorsalgia, unspecified: Secondary | ICD-10-CM | POA: Diagnosis not present

## 2019-01-14 DIAGNOSIS — D649 Anemia, unspecified: Secondary | ICD-10-CM | POA: Diagnosis not present

## 2019-01-14 DIAGNOSIS — Z9181 History of falling: Secondary | ICD-10-CM | POA: Diagnosis not present

## 2019-01-14 DIAGNOSIS — G35 Multiple sclerosis: Secondary | ICD-10-CM | POA: Diagnosis not present

## 2019-01-14 DIAGNOSIS — F419 Anxiety disorder, unspecified: Secondary | ICD-10-CM | POA: Diagnosis not present

## 2019-01-14 DIAGNOSIS — Z993 Dependence on wheelchair: Secondary | ICD-10-CM | POA: Diagnosis not present

## 2019-01-14 DIAGNOSIS — M797 Fibromyalgia: Secondary | ICD-10-CM | POA: Diagnosis not present

## 2019-01-14 DIAGNOSIS — F331 Major depressive disorder, recurrent, moderate: Secondary | ICD-10-CM | POA: Diagnosis not present

## 2019-01-14 DIAGNOSIS — M810 Age-related osteoporosis without current pathological fracture: Secondary | ICD-10-CM | POA: Diagnosis not present

## 2019-01-14 DIAGNOSIS — I10 Essential (primary) hypertension: Secondary | ICD-10-CM | POA: Diagnosis not present

## 2019-01-14 DIAGNOSIS — M069 Rheumatoid arthritis, unspecified: Secondary | ICD-10-CM | POA: Diagnosis not present

## 2019-01-18 DIAGNOSIS — G35 Multiple sclerosis: Secondary | ICD-10-CM | POA: Diagnosis not present

## 2019-01-18 DIAGNOSIS — I10 Essential (primary) hypertension: Secondary | ICD-10-CM | POA: Diagnosis not present

## 2019-01-18 DIAGNOSIS — M810 Age-related osteoporosis without current pathological fracture: Secondary | ICD-10-CM | POA: Diagnosis not present

## 2019-01-18 DIAGNOSIS — F331 Major depressive disorder, recurrent, moderate: Secondary | ICD-10-CM | POA: Diagnosis not present

## 2019-01-18 DIAGNOSIS — F419 Anxiety disorder, unspecified: Secondary | ICD-10-CM | POA: Diagnosis not present

## 2019-01-18 DIAGNOSIS — D649 Anemia, unspecified: Secondary | ICD-10-CM | POA: Diagnosis not present

## 2019-01-20 DIAGNOSIS — D649 Anemia, unspecified: Secondary | ICD-10-CM | POA: Diagnosis not present

## 2019-01-20 DIAGNOSIS — M810 Age-related osteoporosis without current pathological fracture: Secondary | ICD-10-CM | POA: Diagnosis not present

## 2019-01-20 DIAGNOSIS — G35 Multiple sclerosis: Secondary | ICD-10-CM | POA: Diagnosis not present

## 2019-01-20 DIAGNOSIS — F331 Major depressive disorder, recurrent, moderate: Secondary | ICD-10-CM | POA: Diagnosis not present

## 2019-01-20 DIAGNOSIS — I10 Essential (primary) hypertension: Secondary | ICD-10-CM | POA: Diagnosis not present

## 2019-01-20 DIAGNOSIS — F419 Anxiety disorder, unspecified: Secondary | ICD-10-CM | POA: Diagnosis not present

## 2019-01-21 DIAGNOSIS — M810 Age-related osteoporosis without current pathological fracture: Secondary | ICD-10-CM | POA: Diagnosis not present

## 2019-01-21 DIAGNOSIS — F331 Major depressive disorder, recurrent, moderate: Secondary | ICD-10-CM | POA: Diagnosis not present

## 2019-01-21 DIAGNOSIS — G35 Multiple sclerosis: Secondary | ICD-10-CM | POA: Diagnosis not present

## 2019-01-21 DIAGNOSIS — D649 Anemia, unspecified: Secondary | ICD-10-CM | POA: Diagnosis not present

## 2019-01-21 DIAGNOSIS — I10 Essential (primary) hypertension: Secondary | ICD-10-CM | POA: Diagnosis not present

## 2019-01-21 DIAGNOSIS — F419 Anxiety disorder, unspecified: Secondary | ICD-10-CM | POA: Diagnosis not present

## 2019-02-10 DIAGNOSIS — E782 Mixed hyperlipidemia: Secondary | ICD-10-CM | POA: Diagnosis not present

## 2019-02-10 DIAGNOSIS — I10 Essential (primary) hypertension: Secondary | ICD-10-CM | POA: Diagnosis not present

## 2019-02-13 DIAGNOSIS — D649 Anemia, unspecified: Secondary | ICD-10-CM | POA: Diagnosis not present

## 2019-02-13 DIAGNOSIS — I10 Essential (primary) hypertension: Secondary | ICD-10-CM | POA: Diagnosis not present

## 2019-02-13 DIAGNOSIS — Z9181 History of falling: Secondary | ICD-10-CM | POA: Diagnosis not present

## 2019-02-13 DIAGNOSIS — M797 Fibromyalgia: Secondary | ICD-10-CM | POA: Diagnosis not present

## 2019-02-13 DIAGNOSIS — G35 Multiple sclerosis: Secondary | ICD-10-CM | POA: Diagnosis not present

## 2019-02-13 DIAGNOSIS — M069 Rheumatoid arthritis, unspecified: Secondary | ICD-10-CM | POA: Diagnosis not present

## 2019-02-13 DIAGNOSIS — F331 Major depressive disorder, recurrent, moderate: Secondary | ICD-10-CM | POA: Diagnosis not present

## 2019-02-13 DIAGNOSIS — M549 Dorsalgia, unspecified: Secondary | ICD-10-CM | POA: Diagnosis not present

## 2019-02-13 DIAGNOSIS — M810 Age-related osteoporosis without current pathological fracture: Secondary | ICD-10-CM | POA: Diagnosis not present

## 2019-02-13 DIAGNOSIS — F419 Anxiety disorder, unspecified: Secondary | ICD-10-CM | POA: Diagnosis not present

## 2019-02-13 DIAGNOSIS — Z993 Dependence on wheelchair: Secondary | ICD-10-CM | POA: Diagnosis not present

## 2019-02-15 DIAGNOSIS — S2232XA Fracture of one rib, left side, initial encounter for closed fracture: Secondary | ICD-10-CM | POA: Diagnosis not present

## 2019-02-15 DIAGNOSIS — I10 Essential (primary) hypertension: Secondary | ICD-10-CM | POA: Diagnosis not present

## 2019-02-16 DIAGNOSIS — M545 Low back pain: Secondary | ICD-10-CM | POA: Diagnosis not present

## 2019-02-16 DIAGNOSIS — S29019A Strain of muscle and tendon of unspecified wall of thorax, initial encounter: Secondary | ICD-10-CM | POA: Diagnosis not present

## 2019-02-17 DIAGNOSIS — F419 Anxiety disorder, unspecified: Secondary | ICD-10-CM | POA: Diagnosis not present

## 2019-02-17 DIAGNOSIS — G35 Multiple sclerosis: Secondary | ICD-10-CM | POA: Diagnosis not present

## 2019-02-17 DIAGNOSIS — M810 Age-related osteoporosis without current pathological fracture: Secondary | ICD-10-CM | POA: Diagnosis not present

## 2019-02-17 DIAGNOSIS — I10 Essential (primary) hypertension: Secondary | ICD-10-CM | POA: Diagnosis not present

## 2019-02-17 DIAGNOSIS — F331 Major depressive disorder, recurrent, moderate: Secondary | ICD-10-CM | POA: Diagnosis not present

## 2019-02-17 DIAGNOSIS — D649 Anemia, unspecified: Secondary | ICD-10-CM | POA: Diagnosis not present

## 2019-02-18 DIAGNOSIS — M810 Age-related osteoporosis without current pathological fracture: Secondary | ICD-10-CM | POA: Diagnosis not present

## 2019-02-18 DIAGNOSIS — F419 Anxiety disorder, unspecified: Secondary | ICD-10-CM | POA: Diagnosis not present

## 2019-02-18 DIAGNOSIS — D649 Anemia, unspecified: Secondary | ICD-10-CM | POA: Diagnosis not present

## 2019-02-18 DIAGNOSIS — G35 Multiple sclerosis: Secondary | ICD-10-CM | POA: Diagnosis not present

## 2019-02-18 DIAGNOSIS — F331 Major depressive disorder, recurrent, moderate: Secondary | ICD-10-CM | POA: Diagnosis not present

## 2019-02-18 DIAGNOSIS — I10 Essential (primary) hypertension: Secondary | ICD-10-CM | POA: Diagnosis not present

## 2019-02-22 DIAGNOSIS — I10 Essential (primary) hypertension: Secondary | ICD-10-CM | POA: Diagnosis not present

## 2019-02-22 DIAGNOSIS — M810 Age-related osteoporosis without current pathological fracture: Secondary | ICD-10-CM | POA: Diagnosis not present

## 2019-02-22 DIAGNOSIS — F331 Major depressive disorder, recurrent, moderate: Secondary | ICD-10-CM | POA: Diagnosis not present

## 2019-02-22 DIAGNOSIS — G35 Multiple sclerosis: Secondary | ICD-10-CM | POA: Diagnosis not present

## 2019-02-22 DIAGNOSIS — F419 Anxiety disorder, unspecified: Secondary | ICD-10-CM | POA: Diagnosis not present

## 2019-02-22 DIAGNOSIS — D649 Anemia, unspecified: Secondary | ICD-10-CM | POA: Diagnosis not present

## 2019-02-23 DIAGNOSIS — F419 Anxiety disorder, unspecified: Secondary | ICD-10-CM | POA: Diagnosis not present

## 2019-02-23 DIAGNOSIS — G35 Multiple sclerosis: Secondary | ICD-10-CM | POA: Diagnosis not present

## 2019-02-23 DIAGNOSIS — D649 Anemia, unspecified: Secondary | ICD-10-CM | POA: Diagnosis not present

## 2019-02-23 DIAGNOSIS — I10 Essential (primary) hypertension: Secondary | ICD-10-CM | POA: Diagnosis not present

## 2019-02-23 DIAGNOSIS — F331 Major depressive disorder, recurrent, moderate: Secondary | ICD-10-CM | POA: Diagnosis not present

## 2019-02-23 DIAGNOSIS — M810 Age-related osteoporosis without current pathological fracture: Secondary | ICD-10-CM | POA: Diagnosis not present

## 2019-03-01 DIAGNOSIS — M810 Age-related osteoporosis without current pathological fracture: Secondary | ICD-10-CM | POA: Diagnosis not present

## 2019-03-01 DIAGNOSIS — I10 Essential (primary) hypertension: Secondary | ICD-10-CM | POA: Diagnosis not present

## 2019-03-01 DIAGNOSIS — F419 Anxiety disorder, unspecified: Secondary | ICD-10-CM | POA: Diagnosis not present

## 2019-03-01 DIAGNOSIS — G35 Multiple sclerosis: Secondary | ICD-10-CM | POA: Diagnosis not present

## 2019-03-01 DIAGNOSIS — D649 Anemia, unspecified: Secondary | ICD-10-CM | POA: Diagnosis not present

## 2019-03-01 DIAGNOSIS — F331 Major depressive disorder, recurrent, moderate: Secondary | ICD-10-CM | POA: Diagnosis not present

## 2019-03-04 DIAGNOSIS — F331 Major depressive disorder, recurrent, moderate: Secondary | ICD-10-CM | POA: Diagnosis not present

## 2019-03-04 DIAGNOSIS — I10 Essential (primary) hypertension: Secondary | ICD-10-CM | POA: Diagnosis not present

## 2019-03-04 DIAGNOSIS — F419 Anxiety disorder, unspecified: Secondary | ICD-10-CM | POA: Diagnosis not present

## 2019-03-04 DIAGNOSIS — G35 Multiple sclerosis: Secondary | ICD-10-CM | POA: Diagnosis not present

## 2019-03-04 DIAGNOSIS — M810 Age-related osteoporosis without current pathological fracture: Secondary | ICD-10-CM | POA: Diagnosis not present

## 2019-03-04 DIAGNOSIS — D649 Anemia, unspecified: Secondary | ICD-10-CM | POA: Diagnosis not present

## 2019-03-09 DIAGNOSIS — F419 Anxiety disorder, unspecified: Secondary | ICD-10-CM | POA: Diagnosis not present

## 2019-03-09 DIAGNOSIS — M545 Low back pain: Secondary | ICD-10-CM | POA: Diagnosis not present

## 2019-03-09 DIAGNOSIS — M810 Age-related osteoporosis without current pathological fracture: Secondary | ICD-10-CM | POA: Diagnosis not present

## 2019-03-09 DIAGNOSIS — F331 Major depressive disorder, recurrent, moderate: Secondary | ICD-10-CM | POA: Diagnosis not present

## 2019-03-09 DIAGNOSIS — D649 Anemia, unspecified: Secondary | ICD-10-CM | POA: Diagnosis not present

## 2019-03-09 DIAGNOSIS — G35 Multiple sclerosis: Secondary | ICD-10-CM | POA: Diagnosis not present

## 2019-03-09 DIAGNOSIS — I10 Essential (primary) hypertension: Secondary | ICD-10-CM | POA: Diagnosis not present

## 2019-03-11 DIAGNOSIS — F419 Anxiety disorder, unspecified: Secondary | ICD-10-CM | POA: Diagnosis not present

## 2019-03-11 DIAGNOSIS — I10 Essential (primary) hypertension: Secondary | ICD-10-CM | POA: Diagnosis not present

## 2019-03-11 DIAGNOSIS — M810 Age-related osteoporosis without current pathological fracture: Secondary | ICD-10-CM | POA: Diagnosis not present

## 2019-03-11 DIAGNOSIS — G35 Multiple sclerosis: Secondary | ICD-10-CM | POA: Diagnosis not present

## 2019-03-11 DIAGNOSIS — D649 Anemia, unspecified: Secondary | ICD-10-CM | POA: Diagnosis not present

## 2019-03-11 DIAGNOSIS — F331 Major depressive disorder, recurrent, moderate: Secondary | ICD-10-CM | POA: Diagnosis not present

## 2019-03-11 DIAGNOSIS — E7849 Other hyperlipidemia: Secondary | ICD-10-CM | POA: Diagnosis not present

## 2019-03-14 DIAGNOSIS — D649 Anemia, unspecified: Secondary | ICD-10-CM | POA: Diagnosis not present

## 2019-03-14 DIAGNOSIS — I10 Essential (primary) hypertension: Secondary | ICD-10-CM | POA: Diagnosis not present

## 2019-03-14 DIAGNOSIS — G35 Multiple sclerosis: Secondary | ICD-10-CM | POA: Diagnosis not present

## 2019-03-14 DIAGNOSIS — M810 Age-related osteoporosis without current pathological fracture: Secondary | ICD-10-CM | POA: Diagnosis not present

## 2019-03-14 DIAGNOSIS — F331 Major depressive disorder, recurrent, moderate: Secondary | ICD-10-CM | POA: Diagnosis not present

## 2019-03-14 DIAGNOSIS — F419 Anxiety disorder, unspecified: Secondary | ICD-10-CM | POA: Diagnosis not present

## 2019-03-21 DIAGNOSIS — Z1231 Encounter for screening mammogram for malignant neoplasm of breast: Secondary | ICD-10-CM | POA: Diagnosis not present

## 2019-03-24 DIAGNOSIS — G35 Multiple sclerosis: Secondary | ICD-10-CM | POA: Diagnosis not present

## 2019-03-24 DIAGNOSIS — H9012 Conductive hearing loss, unilateral, left ear, with unrestricted hearing on the contralateral side: Secondary | ICD-10-CM | POA: Diagnosis not present

## 2019-03-24 DIAGNOSIS — H6122 Impacted cerumen, left ear: Secondary | ICD-10-CM | POA: Diagnosis not present

## 2019-03-25 DIAGNOSIS — Z20828 Contact with and (suspected) exposure to other viral communicable diseases: Secondary | ICD-10-CM | POA: Diagnosis not present

## 2019-03-30 DIAGNOSIS — N6489 Other specified disorders of breast: Secondary | ICD-10-CM | POA: Diagnosis not present

## 2019-03-30 DIAGNOSIS — R921 Mammographic calcification found on diagnostic imaging of breast: Secondary | ICD-10-CM | POA: Diagnosis not present

## 2019-04-05 DIAGNOSIS — D649 Anemia, unspecified: Secondary | ICD-10-CM | POA: Diagnosis not present

## 2019-04-05 DIAGNOSIS — M5440 Lumbago with sciatica, unspecified side: Secondary | ICD-10-CM | POA: Diagnosis not present

## 2019-04-05 DIAGNOSIS — M130 Polyarthritis, unspecified: Secondary | ICD-10-CM | POA: Diagnosis not present

## 2019-04-05 DIAGNOSIS — M25561 Pain in right knee: Secondary | ICD-10-CM | POA: Diagnosis not present

## 2019-04-08 DIAGNOSIS — I10 Essential (primary) hypertension: Secondary | ICD-10-CM | POA: Diagnosis not present

## 2019-04-08 DIAGNOSIS — E7849 Other hyperlipidemia: Secondary | ICD-10-CM | POA: Diagnosis not present

## 2019-04-14 DIAGNOSIS — E782 Mixed hyperlipidemia: Secondary | ICD-10-CM | POA: Diagnosis not present

## 2019-04-14 DIAGNOSIS — I10 Essential (primary) hypertension: Secondary | ICD-10-CM | POA: Diagnosis not present

## 2019-04-14 DIAGNOSIS — R5383 Other fatigue: Secondary | ICD-10-CM | POA: Diagnosis not present

## 2019-04-18 DIAGNOSIS — Z23 Encounter for immunization: Secondary | ICD-10-CM | POA: Diagnosis not present

## 2019-04-19 DIAGNOSIS — M5440 Lumbago with sciatica, unspecified side: Secondary | ICD-10-CM | POA: Diagnosis not present

## 2019-04-19 DIAGNOSIS — M7072 Other bursitis of hip, left hip: Secondary | ICD-10-CM | POA: Diagnosis not present

## 2019-04-19 DIAGNOSIS — E559 Vitamin D deficiency, unspecified: Secondary | ICD-10-CM | POA: Diagnosis not present

## 2019-04-19 DIAGNOSIS — M7071 Other bursitis of hip, right hip: Secondary | ICD-10-CM | POA: Diagnosis not present

## 2019-04-19 DIAGNOSIS — M797 Fibromyalgia: Secondary | ICD-10-CM | POA: Diagnosis not present

## 2019-04-19 DIAGNOSIS — Z79899 Other long term (current) drug therapy: Secondary | ICD-10-CM | POA: Diagnosis not present

## 2019-04-19 DIAGNOSIS — M130 Polyarthritis, unspecified: Secondary | ICD-10-CM | POA: Diagnosis not present

## 2019-05-07 DIAGNOSIS — Z882 Allergy status to sulfonamides status: Secondary | ICD-10-CM | POA: Diagnosis not present

## 2019-05-07 DIAGNOSIS — S134XXA Sprain of ligaments of cervical spine, initial encounter: Secondary | ICD-10-CM | POA: Diagnosis not present

## 2019-05-07 DIAGNOSIS — W19XXXA Unspecified fall, initial encounter: Secondary | ICD-10-CM | POA: Diagnosis not present

## 2019-05-07 DIAGNOSIS — I10 Essential (primary) hypertension: Secondary | ICD-10-CM | POA: Diagnosis not present

## 2019-05-07 DIAGNOSIS — S139XXA Sprain of joints and ligaments of unspecified parts of neck, initial encounter: Secondary | ICD-10-CM | POA: Diagnosis not present

## 2019-05-07 DIAGNOSIS — M5489 Other dorsalgia: Secondary | ICD-10-CM | POA: Diagnosis not present

## 2019-05-07 DIAGNOSIS — Z885 Allergy status to narcotic agent status: Secondary | ICD-10-CM | POA: Diagnosis not present

## 2019-05-07 DIAGNOSIS — R52 Pain, unspecified: Secondary | ICD-10-CM | POA: Diagnosis not present

## 2019-05-07 DIAGNOSIS — I714 Abdominal aortic aneurysm, without rupture: Secondary | ICD-10-CM | POA: Diagnosis not present

## 2019-05-07 DIAGNOSIS — G35 Multiple sclerosis: Secondary | ICD-10-CM | POA: Diagnosis not present

## 2019-05-07 DIAGNOSIS — N200 Calculus of kidney: Secondary | ICD-10-CM | POA: Diagnosis not present

## 2019-05-07 DIAGNOSIS — S300XXA Contusion of lower back and pelvis, initial encounter: Secondary | ICD-10-CM | POA: Diagnosis not present

## 2019-05-07 DIAGNOSIS — S0990XA Unspecified injury of head, initial encounter: Secondary | ICD-10-CM | POA: Diagnosis not present

## 2019-05-07 DIAGNOSIS — K449 Diaphragmatic hernia without obstruction or gangrene: Secondary | ICD-10-CM | POA: Diagnosis not present

## 2019-05-07 DIAGNOSIS — R079 Chest pain, unspecified: Secondary | ICD-10-CM | POA: Diagnosis not present

## 2019-05-07 DIAGNOSIS — W1830XA Fall on same level, unspecified, initial encounter: Secondary | ICD-10-CM | POA: Diagnosis not present

## 2019-05-07 DIAGNOSIS — S20219A Contusion of unspecified front wall of thorax, initial encounter: Secondary | ICD-10-CM | POA: Diagnosis not present

## 2019-05-07 DIAGNOSIS — Z79899 Other long term (current) drug therapy: Secondary | ICD-10-CM | POA: Diagnosis not present

## 2019-05-07 DIAGNOSIS — M549 Dorsalgia, unspecified: Secondary | ICD-10-CM | POA: Diagnosis not present

## 2019-05-07 DIAGNOSIS — S20229A Contusion of unspecified back wall of thorax, initial encounter: Secondary | ICD-10-CM | POA: Diagnosis not present

## 2019-05-07 DIAGNOSIS — M50322 Other cervical disc degeneration at C5-C6 level: Secondary | ICD-10-CM | POA: Diagnosis not present

## 2019-05-07 DIAGNOSIS — S0093XA Contusion of unspecified part of head, initial encounter: Secondary | ICD-10-CM | POA: Diagnosis not present

## 2019-05-10 DIAGNOSIS — R0789 Other chest pain: Secondary | ICD-10-CM | POA: Diagnosis not present

## 2019-05-10 DIAGNOSIS — Z8781 Personal history of (healed) traumatic fracture: Secondary | ICD-10-CM | POA: Diagnosis not present

## 2019-05-10 DIAGNOSIS — G35 Multiple sclerosis: Secondary | ICD-10-CM | POA: Diagnosis not present

## 2019-05-10 DIAGNOSIS — W19XXXA Unspecified fall, initial encounter: Secondary | ICD-10-CM | POA: Diagnosis not present

## 2019-05-11 DIAGNOSIS — I10 Essential (primary) hypertension: Secondary | ICD-10-CM | POA: Diagnosis not present

## 2019-05-11 DIAGNOSIS — E7849 Other hyperlipidemia: Secondary | ICD-10-CM | POA: Diagnosis not present

## 2019-05-13 DIAGNOSIS — I8392 Asymptomatic varicose veins of left lower extremity: Secondary | ICD-10-CM | POA: Diagnosis not present

## 2019-05-13 DIAGNOSIS — I351 Nonrheumatic aortic (valve) insufficiency: Secondary | ICD-10-CM | POA: Diagnosis not present

## 2019-05-13 DIAGNOSIS — I739 Peripheral vascular disease, unspecified: Secondary | ICD-10-CM | POA: Diagnosis not present

## 2019-05-13 DIAGNOSIS — M79604 Pain in right leg: Secondary | ICD-10-CM | POA: Diagnosis not present

## 2019-05-13 DIAGNOSIS — M79605 Pain in left leg: Secondary | ICD-10-CM | POA: Diagnosis not present

## 2019-05-13 DIAGNOSIS — I493 Ventricular premature depolarization: Secondary | ICD-10-CM | POA: Diagnosis not present

## 2019-05-13 DIAGNOSIS — I10 Essential (primary) hypertension: Secondary | ICD-10-CM | POA: Diagnosis not present

## 2019-05-13 DIAGNOSIS — R600 Localized edema: Secondary | ICD-10-CM | POA: Diagnosis not present

## 2019-05-16 DIAGNOSIS — E782 Mixed hyperlipidemia: Secondary | ICD-10-CM | POA: Diagnosis not present

## 2019-05-16 DIAGNOSIS — F331 Major depressive disorder, recurrent, moderate: Secondary | ICD-10-CM | POA: Diagnosis not present

## 2019-05-16 DIAGNOSIS — M810 Age-related osteoporosis without current pathological fracture: Secondary | ICD-10-CM | POA: Diagnosis not present

## 2019-05-16 DIAGNOSIS — M05419 Rheumatoid myopathy with rheumatoid arthritis of unspecified shoulder: Secondary | ICD-10-CM | POA: Diagnosis not present

## 2019-05-16 DIAGNOSIS — D500 Iron deficiency anemia secondary to blood loss (chronic): Secondary | ICD-10-CM | POA: Diagnosis not present

## 2019-05-16 DIAGNOSIS — I10 Essential (primary) hypertension: Secondary | ICD-10-CM | POA: Diagnosis not present

## 2019-05-16 DIAGNOSIS — G35 Multiple sclerosis: Secondary | ICD-10-CM | POA: Diagnosis not present

## 2019-05-16 DIAGNOSIS — R4582 Worries: Secondary | ICD-10-CM | POA: Diagnosis not present

## 2019-05-17 DIAGNOSIS — Z23 Encounter for immunization: Secondary | ICD-10-CM | POA: Diagnosis not present

## 2019-05-18 DIAGNOSIS — K219 Gastro-esophageal reflux disease without esophagitis: Secondary | ICD-10-CM | POA: Diagnosis present

## 2019-05-18 DIAGNOSIS — N390 Urinary tract infection, site not specified: Secondary | ICD-10-CM | POA: Diagnosis not present

## 2019-05-18 DIAGNOSIS — S22080A Wedge compression fracture of T11-T12 vertebra, initial encounter for closed fracture: Secondary | ICD-10-CM | POA: Diagnosis not present

## 2019-05-18 DIAGNOSIS — R52 Pain, unspecified: Secondary | ICD-10-CM | POA: Diagnosis not present

## 2019-05-18 DIAGNOSIS — M1990 Unspecified osteoarthritis, unspecified site: Secondary | ICD-10-CM | POA: Diagnosis present

## 2019-05-18 DIAGNOSIS — K5289 Other specified noninfective gastroenteritis and colitis: Secondary | ICD-10-CM | POA: Diagnosis not present

## 2019-05-18 DIAGNOSIS — Z885 Allergy status to narcotic agent status: Secondary | ICD-10-CM | POA: Diagnosis not present

## 2019-05-18 DIAGNOSIS — A419 Sepsis, unspecified organism: Secondary | ICD-10-CM | POA: Diagnosis not present

## 2019-05-18 DIAGNOSIS — F419 Anxiety disorder, unspecified: Secondary | ICD-10-CM | POA: Diagnosis present

## 2019-05-18 DIAGNOSIS — Z9049 Acquired absence of other specified parts of digestive tract: Secondary | ICD-10-CM | POA: Diagnosis not present

## 2019-05-18 DIAGNOSIS — K529 Noninfective gastroenteritis and colitis, unspecified: Secondary | ICD-10-CM | POA: Diagnosis not present

## 2019-05-18 DIAGNOSIS — D751 Secondary polycythemia: Secondary | ICD-10-CM | POA: Diagnosis present

## 2019-05-18 DIAGNOSIS — W19XXXA Unspecified fall, initial encounter: Secondary | ICD-10-CM | POA: Diagnosis not present

## 2019-05-18 DIAGNOSIS — R7401 Elevation of levels of liver transaminase levels: Secondary | ICD-10-CM | POA: Diagnosis present

## 2019-05-18 DIAGNOSIS — S139XXA Sprain of joints and ligaments of unspecified parts of neck, initial encounter: Secondary | ICD-10-CM | POA: Diagnosis not present

## 2019-05-18 DIAGNOSIS — S22050A Wedge compression fracture of T5-T6 vertebra, initial encounter for closed fracture: Secondary | ICD-10-CM | POA: Diagnosis not present

## 2019-05-18 DIAGNOSIS — I491 Atrial premature depolarization: Secondary | ICD-10-CM | POA: Diagnosis not present

## 2019-05-18 DIAGNOSIS — R2689 Other abnormalities of gait and mobility: Secondary | ICD-10-CM | POA: Diagnosis not present

## 2019-05-18 DIAGNOSIS — Z9889 Other specified postprocedural states: Secondary | ICD-10-CM | POA: Diagnosis not present

## 2019-05-18 DIAGNOSIS — Z90710 Acquired absence of both cervix and uterus: Secondary | ICD-10-CM | POA: Diagnosis not present

## 2019-05-18 DIAGNOSIS — R1110 Vomiting, unspecified: Secondary | ICD-10-CM | POA: Diagnosis not present

## 2019-05-18 DIAGNOSIS — I10 Essential (primary) hypertension: Secondary | ICD-10-CM | POA: Diagnosis present

## 2019-05-18 DIAGNOSIS — Z882 Allergy status to sulfonamides status: Secondary | ICD-10-CM | POA: Diagnosis not present

## 2019-05-18 DIAGNOSIS — I714 Abdominal aortic aneurysm, without rupture: Secondary | ICD-10-CM | POA: Diagnosis present

## 2019-05-18 DIAGNOSIS — G35 Multiple sclerosis: Secondary | ICD-10-CM | POA: Diagnosis not present

## 2019-05-18 DIAGNOSIS — M542 Cervicalgia: Secondary | ICD-10-CM | POA: Diagnosis not present

## 2019-05-18 DIAGNOSIS — I499 Cardiac arrhythmia, unspecified: Secondary | ICD-10-CM | POA: Diagnosis not present

## 2019-05-18 DIAGNOSIS — Z20822 Contact with and (suspected) exposure to covid-19: Secondary | ICD-10-CM | POA: Diagnosis present

## 2019-05-18 DIAGNOSIS — I739 Peripheral vascular disease, unspecified: Secondary | ICD-10-CM | POA: Diagnosis present

## 2019-05-18 DIAGNOSIS — S22000A Wedge compression fracture of unspecified thoracic vertebra, initial encounter for closed fracture: Secondary | ICD-10-CM | POA: Diagnosis not present

## 2019-05-18 DIAGNOSIS — J9601 Acute respiratory failure with hypoxia: Secondary | ICD-10-CM | POA: Diagnosis not present

## 2019-05-18 DIAGNOSIS — E860 Dehydration: Secondary | ICD-10-CM | POA: Diagnosis not present

## 2019-05-18 DIAGNOSIS — M797 Fibromyalgia: Secondary | ICD-10-CM | POA: Diagnosis present

## 2019-05-18 DIAGNOSIS — I447 Left bundle-branch block, unspecified: Secondary | ICD-10-CM | POA: Diagnosis not present

## 2019-05-18 DIAGNOSIS — A084 Viral intestinal infection, unspecified: Secondary | ICD-10-CM | POA: Diagnosis not present

## 2019-05-18 DIAGNOSIS — G8220 Paraplegia, unspecified: Secondary | ICD-10-CM | POA: Diagnosis not present

## 2019-05-18 DIAGNOSIS — K5530 Necrotizing enterocolitis, unspecified: Secondary | ICD-10-CM | POA: Diagnosis not present

## 2019-05-18 DIAGNOSIS — E872 Acidosis: Secondary | ICD-10-CM | POA: Diagnosis not present

## 2019-05-23 DIAGNOSIS — F331 Major depressive disorder, recurrent, moderate: Secondary | ICD-10-CM | POA: Diagnosis not present

## 2019-05-23 DIAGNOSIS — S3219XD Other fracture of sacrum, subsequent encounter for fracture with routine healing: Secondary | ICD-10-CM | POA: Diagnosis not present

## 2019-05-23 DIAGNOSIS — G8929 Other chronic pain: Secondary | ICD-10-CM | POA: Diagnosis not present

## 2019-05-23 DIAGNOSIS — N3000 Acute cystitis without hematuria: Secondary | ICD-10-CM | POA: Diagnosis not present

## 2019-05-23 DIAGNOSIS — I10 Essential (primary) hypertension: Secondary | ICD-10-CM | POA: Diagnosis not present

## 2019-05-23 DIAGNOSIS — J9611 Chronic respiratory failure with hypoxia: Secondary | ICD-10-CM | POA: Diagnosis not present

## 2019-05-23 DIAGNOSIS — E782 Mixed hyperlipidemia: Secondary | ICD-10-CM | POA: Diagnosis not present

## 2019-05-23 DIAGNOSIS — Z7952 Long term (current) use of systemic steroids: Secondary | ICD-10-CM | POA: Diagnosis not present

## 2019-05-23 DIAGNOSIS — G35 Multiple sclerosis: Secondary | ICD-10-CM | POA: Diagnosis not present

## 2019-05-23 DIAGNOSIS — D509 Iron deficiency anemia, unspecified: Secondary | ICD-10-CM | POA: Diagnosis not present

## 2019-05-23 DIAGNOSIS — M069 Rheumatoid arthritis, unspecified: Secondary | ICD-10-CM | POA: Diagnosis not present

## 2019-05-23 DIAGNOSIS — S22080D Wedge compression fracture of T11-T12 vertebra, subsequent encounter for fracture with routine healing: Secondary | ICD-10-CM | POA: Diagnosis not present

## 2019-05-23 DIAGNOSIS — M1990 Unspecified osteoarthritis, unspecified site: Secondary | ICD-10-CM | POA: Diagnosis not present

## 2019-05-23 DIAGNOSIS — I739 Peripheral vascular disease, unspecified: Secondary | ICD-10-CM | POA: Diagnosis not present

## 2019-05-23 DIAGNOSIS — M797 Fibromyalgia: Secondary | ICD-10-CM | POA: Diagnosis not present

## 2019-05-23 DIAGNOSIS — Z9181 History of falling: Secondary | ICD-10-CM | POA: Diagnosis not present

## 2019-05-23 DIAGNOSIS — F419 Anxiety disorder, unspecified: Secondary | ICD-10-CM | POA: Diagnosis not present

## 2019-05-23 DIAGNOSIS — A084 Viral intestinal infection, unspecified: Secondary | ICD-10-CM | POA: Diagnosis not present

## 2019-05-24 DIAGNOSIS — G35 Multiple sclerosis: Secondary | ICD-10-CM | POA: Diagnosis not present

## 2019-05-24 DIAGNOSIS — R7401 Elevation of levels of liver transaminase levels: Secondary | ICD-10-CM | POA: Diagnosis not present

## 2019-05-24 DIAGNOSIS — A419 Sepsis, unspecified organism: Secondary | ICD-10-CM | POA: Diagnosis not present

## 2019-05-24 DIAGNOSIS — E872 Acidosis: Secondary | ICD-10-CM | POA: Diagnosis not present

## 2019-05-24 DIAGNOSIS — S22050A Wedge compression fracture of T5-T6 vertebra, initial encounter for closed fracture: Secondary | ICD-10-CM | POA: Diagnosis not present

## 2019-05-24 DIAGNOSIS — K859 Acute pancreatitis without necrosis or infection, unspecified: Secondary | ICD-10-CM | POA: Diagnosis not present

## 2019-05-25 DIAGNOSIS — Z20828 Contact with and (suspected) exposure to other viral communicable diseases: Secondary | ICD-10-CM | POA: Diagnosis not present

## 2019-05-26 DIAGNOSIS — S3219XD Other fracture of sacrum, subsequent encounter for fracture with routine healing: Secondary | ICD-10-CM | POA: Diagnosis not present

## 2019-05-26 DIAGNOSIS — G8929 Other chronic pain: Secondary | ICD-10-CM | POA: Diagnosis not present

## 2019-05-26 DIAGNOSIS — D500 Iron deficiency anemia secondary to blood loss (chronic): Secondary | ICD-10-CM | POA: Diagnosis not present

## 2019-05-26 DIAGNOSIS — G35 Multiple sclerosis: Secondary | ICD-10-CM | POA: Diagnosis not present

## 2019-05-26 DIAGNOSIS — N3000 Acute cystitis without hematuria: Secondary | ICD-10-CM | POA: Diagnosis not present

## 2019-05-26 DIAGNOSIS — M810 Age-related osteoporosis without current pathological fracture: Secondary | ICD-10-CM | POA: Diagnosis not present

## 2019-05-26 DIAGNOSIS — E782 Mixed hyperlipidemia: Secondary | ICD-10-CM | POA: Diagnosis not present

## 2019-05-26 DIAGNOSIS — S22000A Wedge compression fracture of unspecified thoracic vertebra, initial encounter for closed fracture: Secondary | ICD-10-CM | POA: Diagnosis not present

## 2019-05-26 DIAGNOSIS — J9611 Chronic respiratory failure with hypoxia: Secondary | ICD-10-CM | POA: Diagnosis not present

## 2019-05-26 DIAGNOSIS — F331 Major depressive disorder, recurrent, moderate: Secondary | ICD-10-CM | POA: Diagnosis not present

## 2019-05-26 DIAGNOSIS — S22080D Wedge compression fracture of T11-T12 vertebra, subsequent encounter for fracture with routine healing: Secondary | ICD-10-CM | POA: Diagnosis not present

## 2019-05-26 DIAGNOSIS — I10 Essential (primary) hypertension: Secondary | ICD-10-CM | POA: Diagnosis not present

## 2019-05-26 DIAGNOSIS — D509 Iron deficiency anemia, unspecified: Secondary | ICD-10-CM | POA: Diagnosis not present

## 2019-05-27 DIAGNOSIS — S22058D Other fracture of T5-T6 vertebra, subsequent encounter for fracture with routine healing: Secondary | ICD-10-CM | POA: Diagnosis not present

## 2019-05-27 DIAGNOSIS — F419 Anxiety disorder, unspecified: Secondary | ICD-10-CM | POA: Diagnosis not present

## 2019-05-27 DIAGNOSIS — G894 Chronic pain syndrome: Secondary | ICD-10-CM | POA: Diagnosis not present

## 2019-05-27 DIAGNOSIS — R41841 Cognitive communication deficit: Secondary | ICD-10-CM | POA: Diagnosis not present

## 2019-05-27 DIAGNOSIS — R2689 Other abnormalities of gait and mobility: Secondary | ICD-10-CM | POA: Diagnosis not present

## 2019-05-27 DIAGNOSIS — Z9181 History of falling: Secondary | ICD-10-CM | POA: Diagnosis not present

## 2019-05-27 DIAGNOSIS — S22080D Wedge compression fracture of T11-T12 vertebra, subsequent encounter for fracture with routine healing: Secondary | ICD-10-CM | POA: Diagnosis not present

## 2019-05-27 DIAGNOSIS — K219 Gastro-esophageal reflux disease without esophagitis: Secondary | ICD-10-CM | POA: Diagnosis not present

## 2019-05-27 DIAGNOSIS — S22050D Wedge compression fracture of T5-T6 vertebra, subsequent encounter for fracture with routine healing: Secondary | ICD-10-CM | POA: Diagnosis not present

## 2019-05-27 DIAGNOSIS — G35 Multiple sclerosis: Secondary | ICD-10-CM | POA: Diagnosis not present

## 2019-05-27 DIAGNOSIS — E7849 Other hyperlipidemia: Secondary | ICD-10-CM | POA: Diagnosis not present

## 2019-05-27 DIAGNOSIS — I714 Abdominal aortic aneurysm, without rupture: Secondary | ICD-10-CM | POA: Diagnosis not present

## 2019-05-27 DIAGNOSIS — Z9981 Dependence on supplemental oxygen: Secondary | ICD-10-CM | POA: Diagnosis not present

## 2019-05-27 DIAGNOSIS — M797 Fibromyalgia: Secondary | ICD-10-CM | POA: Diagnosis not present

## 2019-05-27 DIAGNOSIS — M6281 Muscle weakness (generalized): Secondary | ICD-10-CM | POA: Diagnosis not present

## 2019-05-27 DIAGNOSIS — G8194 Hemiplegia, unspecified affecting left nondominant side: Secondary | ICD-10-CM | POA: Diagnosis not present

## 2019-05-27 DIAGNOSIS — I10 Essential (primary) hypertension: Secondary | ICD-10-CM | POA: Diagnosis not present

## 2019-06-03 ENCOUNTER — Other Ambulatory Visit: Payer: Self-pay | Admitting: *Deleted

## 2019-06-03 NOTE — Patient Outreach (Signed)
Late entry for 06/02/19  Screened for potential [facility] Care Management needs as a benefit of  [employer] Medicare.  Member is receiving skilled therapy at [facility].  Writer attended telephonic interdisciplinary team meeting to assess for disposition needs and transition plan for resident.   Member is from home with husband. She has MS and is max assist with therapy.   Will continue to follow for transition plan and plan outreach accordingly.   Marthenia Rolling, MSN-Ed, RN,BSN [place] Acute Care Coordinator [phone] [facility]) [phone]  (Toll free office)

## 2019-06-10 DIAGNOSIS — E7849 Other hyperlipidemia: Secondary | ICD-10-CM | POA: Diagnosis not present

## 2019-06-10 DIAGNOSIS — G35 Multiple sclerosis: Secondary | ICD-10-CM | POA: Diagnosis not present

## 2019-06-10 DIAGNOSIS — I10 Essential (primary) hypertension: Secondary | ICD-10-CM | POA: Diagnosis not present

## 2019-06-20 DIAGNOSIS — S22058D Other fracture of T5-T6 vertebra, subsequent encounter for fracture with routine healing: Secondary | ICD-10-CM | POA: Diagnosis not present

## 2019-06-21 ENCOUNTER — Other Ambulatory Visit: Payer: Self-pay | Admitting: *Deleted

## 2019-06-21 DIAGNOSIS — I10 Essential (primary) hypertension: Secondary | ICD-10-CM

## 2019-06-21 DIAGNOSIS — E872 Acidosis: Secondary | ICD-10-CM | POA: Diagnosis not present

## 2019-06-21 DIAGNOSIS — G35 Multiple sclerosis: Secondary | ICD-10-CM | POA: Diagnosis not present

## 2019-06-21 DIAGNOSIS — S22050A Wedge compression fracture of T5-T6 vertebra, initial encounter for closed fracture: Secondary | ICD-10-CM | POA: Diagnosis not present

## 2019-06-21 DIAGNOSIS — R7401 Elevation of levels of liver transaminase levels: Secondary | ICD-10-CM | POA: Diagnosis not present

## 2019-06-21 DIAGNOSIS — A419 Sepsis, unspecified organism: Secondary | ICD-10-CM | POA: Diagnosis not present

## 2019-06-21 DIAGNOSIS — K859 Acute pancreatitis without necrosis or infection, unspecified: Secondary | ICD-10-CM | POA: Diagnosis not present

## 2019-06-21 NOTE — Patient Outreach (Signed)
Member screened for potential [facility] Care Management needs as a benefit of [place] Medicare.  Verified in Patient Stephanie Perez that Stephanie Perez transitioned home from [facility] on 06/17/19.   Telephone call made to Stephanie Perez [phone]. Patient identifiers confirmed. Stephanie Perez states she is not up to speaking with writer at the current time. Awaiting return call from PCP office. However, she is agreeable to [place] follow up.   Will make referral for [facility] Care Management RNCM. Stephanie Perez has medical history of MS, osteoporosis, depression, HTN, RA, GERD.   Will follow up with facility SW to confirm home health agency arranged.   Stephanie Rolling, MSN-Ed, RN,BSN [place] Acute Care Coordinator [phone] [facility]) [phone]  (Toll free office)

## 2019-06-24 DIAGNOSIS — R5383 Other fatigue: Secondary | ICD-10-CM | POA: Diagnosis not present

## 2019-06-24 DIAGNOSIS — D649 Anemia, unspecified: Secondary | ICD-10-CM | POA: Diagnosis not present

## 2019-06-24 DIAGNOSIS — E559 Vitamin D deficiency, unspecified: Secondary | ICD-10-CM | POA: Diagnosis not present

## 2019-06-24 DIAGNOSIS — M797 Fibromyalgia: Secondary | ICD-10-CM | POA: Diagnosis not present

## 2019-06-24 DIAGNOSIS — M130 Polyarthritis, unspecified: Secondary | ICD-10-CM | POA: Diagnosis not present

## 2019-06-24 DIAGNOSIS — Z79899 Other long term (current) drug therapy: Secondary | ICD-10-CM | POA: Diagnosis not present

## 2019-06-24 DIAGNOSIS — M25561 Pain in right knee: Secondary | ICD-10-CM | POA: Diagnosis not present

## 2019-06-29 ENCOUNTER — Other Ambulatory Visit: Payer: Self-pay | Admitting: *Deleted

## 2019-06-29 NOTE — Patient Outreach (Signed)
[place] [facility]) Care Management  06/29/2019  Stephanie Perez [birth year] [id number]  [facility] skilled nursing facility (snf) Transition of Care Referral   Referral Date: 06/21/19 Referral Source: Pollyann Samples MSN-Ed, RN, BSN [facility]) coordinator Date of Admission: ?  Reason for consult Please assign to [facility] RNCM  Diagnoses of Other   Other Diagnosis: MS, HTN, depression, OA, RA, GERD   Please assign to [place] for complex case management and care coordination. Dc from [place] on 06/17/19. Agreeable to [facility] CM services. Will confirm with facility SW on name of home health agency.  Insurance: Next[employer] Medicare    Outreach attempt # 1 unsuccessful to [phone] No answer. Line busy. THN RN CM unable to leave a HIPAA ([place]) compliant voicemail message along with CM's contact info.   Conditions MS (multiple sclerosis) chronic respiratory failure with hypoxia, wedge compression fracture of T11-T12 vertebra subsequent, Other fracture of sacrum, subsequent encounter for fracture with routine healing, chronic pain, acute cystitis without hematuria, Hypertension (HTN), fibromyalgia, iron deficiency anemia, history (hx) of falling, viral intestinal infection. Rheumatoid Arthritis (RA) Plan: [facility] RN CM sent an unsuccessful outreach letter and scheduled this patient for another call attempt within 4 business days   Monica Zahler L. Lavina Hamman, RN, BSN, [place] Management Care Coordinator Direct Number [phone] Mobile number [phone]  Main THN number [phone] Fax number [phone]

## 2019-07-05 ENCOUNTER — Other Ambulatory Visit: Payer: Self-pay

## 2019-07-05 ENCOUNTER — Other Ambulatory Visit: Payer: Self-pay | Admitting: *Deleted

## 2019-07-05 ENCOUNTER — Encounter: Payer: Self-pay | Admitting: *Deleted

## 2019-07-05 NOTE — Patient Outreach (Signed)
Stephanie Perez) Care Management  07/05/2019  Stephanie Perez 1940-07-10 [id number]   [facility] (snf) Transition of Care Referral  Referral Date: 06/21/19 Referral Source: Pollyann Samples MSN-Ed, RN, BSN [facility] Acute Care [facility]) coordinator Date of Admission: ?  Reason for consult Please assign to [facility] RN CM  Diagnoses of Other   Other Diagnosis: MS, HTN, depression, OA, RA, GERD   Please assign to [facility] RN CM for complex case management and care coordination. Dc from [facility] on 06/17/19. Agreeable to [facility] CM services. Will confirm with facility SW on name of home health agency.  Insurance: [place] attempt # 2 partially  successful to [phone] only number listed for patient Stephanie Perez answers [facility] RN CM introduces herself and welcomes her home Stephanie Perez is able to verify her name and then Stephanie Perez begins to inform [facility] RN CM of the concerns she has at home  [facility] RN CM informs her of the purpose of the call and discusses the [place] Perez referral after her snf discharge. THN RN CM begins to attempt to complete HIPAA   Stephanie Perez interrupts [facility] RN CM to inform CM she is "not able to walk and get around or dressed and I have a mess in the bathroom" She reports there is "a lady that comes in two times a week. I can not get out of bed and I am paralyzed from the waist down" " It's just [place]" when [facility] RN CM inquired about support system. She reports a Perez bed has been brought out twice but she has had it returned twice. THN RN CM asked permission to speak with her husband but was informed he was "asleep and I am not waking him up. He is already upset"  Noted difficulty with hearing Stephanie Perez on the phone. [facility] RN CM discussed this with her. THN RN CM inquired about the name of the home health agency so that [facility] RN CM could assist with contacting them  Stephanie Perez telephone line disconnected   [facility] RN CM reviewed Stephanie Perez and outreached  to [employer] staff (Publishing copy) to confirmed Stephanie Perez is not active with them since her skilled nursing facility (snf) discharge on 06/17/19  Call to [place] and spoke with the discharge planning staff who confirms Stephanie Perez [facility] [place] RN CM called the number provided [phone] but Theadora Rama confirms an incorrect number and provides [phone] [facility] RN CM called [place] [facility] [phone] and spoke with Brayton Layman, Development worker, international aid, who confirms services are being provided for PT /OT 3 times a week. Stephanie Perez has been seen 7 times since her  06/17/19 discharge. A RN evaluated and found pt without home health needs but she continues to be seen by [facility]. This agency OT services assists with dressing and bathing services.  She was seen in 07/04/19 by "Ricky" This agency does not have SW or aide services.  Brayton Layman does recall concerns with delivered beds x 2, "she did not like how they looked" with plans for another bed delivery.  THN RN CM discussed with Brayton Layman Stephanie Lovan's voiced concerns with her ADLs during the outreach today Brayton Layman will assists with providing information to include [facility] RN CM contact number to the [place] base services(terry) for follow up with Stephanie Perez. This service is generally out  of pocket if the patient does not have medicaid long term care benefits. Stephanie Mathieu only listed with next gen medicare coverage on Epic. Brayton Layman also reports she may be able to have a [facility] clinician to all Stephanie Perez to see if assist is needed. THN RN CM offered to collaborate or assist prn with home health and pcp    Conditions MS (multiple sclerosis) chronic respiratory failure with hypoxia, wedge compression fracture of T11-T12 vertebra subsequent, Other fracture of sacrum, subsequent encounter for fracture with routine healing, chronic pain, acute cystitis without  hematuria, Hypertension (HTN), fibromyalgia, iron deficiency anemia, history (hx) of falling, viral intestinal infection. Rheumatoid Arthritis (RA)   Plan:  [facility] RN CM scheduled this patient for another call attempt within 7-14 business days Routed note to MD Pt sent welcome letter MD sent barriers letter  [facility] CM Care Plan Problem One     Most Recent Value  Care Plan Problem One  Knowledge deficit of home care for MS, HTN  Role Documenting the Problem One  Care Management Telephonic Coordinator  Care Plan for Problem One  Active  THN Long Term Goal   over the next 90 days patient will be ale to verbalize with outreach interventions to manage MS adn HTN at home  [place] Start Date  07/05/19  Interventions for Problem One Long Term Goal  initial assessment started, calls to snf, calls to home health agencies to get clarity on services, possible assist with community based personal care servcies  [facility] CM Short Term Goal #1   patient will be able to verbalize in the next 21 days that services are available to assist her with home care needs  [facility] CM Short Term Goal #1 Start Date  07/05/19  Interventions for Short Term Goal #1  initial assessment started, calls to snf, calls to home health agencies to get clarity on services, possible assist with community based personal care [place]. Stephanie Hamman, RN, BSN, [place] Management Care Coordinator Direct Number [phone] Mobile number [phone]  Main THN number [phone] Fax number [phone]

## 2019-07-05 NOTE — Patient Outreach (Signed)
[place] [facility]) Care Management  07/05/2019  Stephanie Perez 21-Mar-1940 [id number]   [place] coordination- possible personal care services [facility] RN CM received a call from [place] of the [place] community base services [phone]  She and [facility] RN CM discussed the concerns with support for Stephanie Perez unable to find Stephanie Perez listed as an active patient Stephanie Perez discusses and welcomes the opportunity to provide Stephanie Perez with services prn She states the patient can call her to schedule services  Services are $25/hr   Returned a call to Stephanie Perez to updated her on theses services Stephanie Perez frequently interrupts [facility] RN CM as [facility] RN CM began the HIPAA verification and to updated her on the [place] community based services  She is allowed time to ventilate her feelings and she updates [facility] RN CM on her hospital stay ("two weeks") and snf stay She shares her mobility abilities and her  husband has back issues. She shares that her son visits after work.  THN  RN CM answered questions when [facility] RN CM was able to speak   Stephanie Perez was able to write down common[place]'s community base program number as [phone] She states she will attempt to call Stephanie Perez to get more information or continue to use the "lady" that visits her during the week  Plan [facility] RN CM scheduled this patient for another call attempt within 7-14 business days Routed note to [place]. Lavina Hamman, RN, BSN, [place] Coordinator Office number [phone] Mobile number [phone]  Main THN number [phone] Fax number [phone]

## 2019-07-08 DIAGNOSIS — M130 Polyarthritis, unspecified: Secondary | ICD-10-CM | POA: Diagnosis not present

## 2019-07-08 DIAGNOSIS — M79642 Pain in left hand: Secondary | ICD-10-CM | POA: Diagnosis not present

## 2019-07-08 DIAGNOSIS — E559 Vitamin D deficiency, unspecified: Secondary | ICD-10-CM | POA: Diagnosis not present

## 2019-07-08 DIAGNOSIS — M79641 Pain in right hand: Secondary | ICD-10-CM | POA: Diagnosis not present

## 2019-07-11 DIAGNOSIS — I10 Essential (primary) hypertension: Secondary | ICD-10-CM | POA: Diagnosis not present

## 2019-07-11 DIAGNOSIS — G35 Multiple sclerosis: Secondary | ICD-10-CM | POA: Diagnosis not present

## 2019-07-11 DIAGNOSIS — E7849 Other hyperlipidemia: Secondary | ICD-10-CM | POA: Diagnosis not present

## 2019-07-11 DIAGNOSIS — D500 Iron deficiency anemia secondary to blood loss (chronic): Secondary | ICD-10-CM | POA: Diagnosis not present

## 2019-07-13 ENCOUNTER — Other Ambulatory Visit: Payer: Self-pay | Admitting: *Deleted

## 2019-07-13 NOTE — Patient Outreach (Addendum)
[place] [facility]) Care Management  07/13/2019  JAILI VANCLEEF 05/16/1940 [id number]   [facility] skilled nursing facility (snf) Transition of Care Referral  Referral Date: 06/21/19 Referral Source: Pollyann Samples MSN-Ed, RN, BSN [facility]) coordinator Date of Admission: ?  Reason for consult Please assign to [facility] RNCM  Diagnoses of Other   Other Diagnosis: MS, HTN, depression, OA, RA, GERD   Please assign to [place] for complex case management and care coordination. Dc from [place] on 06/17/19. Agreeable to [facility] CM services. Will confirm with facility SW on name of home health agency.  Insurance: Sales promotion account executive    Outreach attempt to the only number listed for patient and spouse of [id number] No answer after a minute of outreach attempts x 2 today   Conditions MS (multiple sclerosis) chronic respiratory failure with hypoxia, wedge compression fracture of T11-T12 vertebra subsequent, Other fracture of sacrum, subsequent encounter for fracture with routine healing, chronic pain, acute cystitis without hematuria, Hypertension (HTN), fibromyalgia, iron deficiency anemia, history (hx) of falling, viral intestinal infection. Rheumatoid Arthritis (RA)   Plan: [facility] RN CM scheduled this patient for another call attempt within 4-7 business days  Leib Elahi L. Lavina Hamman, RN, BSN, [place] Management Care Coordinator Direct Number [phone] Mobile number [phone]  Main THN number [phone] Fax number [phone]

## 2019-07-14 ENCOUNTER — Other Ambulatory Visit: Payer: Self-pay | Admitting: *Deleted

## 2019-07-14 NOTE — Patient Outreach (Addendum)
[place] [facility]) Care Management  07/14/2019  Stephanie Perez 01-Apr-1940 [id number]   [facility] outreach to  skilled nursing facility (snf) Transition of Care Referred patient  Referral Date: 06/21/19 Referral Source: Pollyann Samples MSN-Ed, RN, BSN [facility]) coordinator Date of Admission: ?  Reason for consult Please assign to [facility] RNCM  Diagnoses of Other   Other Diagnosis: MS, HTN, depression, OA, RA, GERD   Please assign to [place] for complex case management and care coordination. Dc from [place] on 06/17/19. Agreeable to [facility] CM services. Will confirm with facility SW on name of home health agency.  Insurance: NextGen Medicare    Outreach attempt partially successful to [phone]  Mr Mcphail answered and gave the phone to Mrs Hilt [facility] RN CM greeted Mrs [place] who verified her name. [facility] RN CM discussed the purpose of the call to follow up on transition of care and interventions from 07/05/19 Mrs Guinyard reports she nor Mr Daigler felt well today  [facility] RN CM attempted to assess the concerns to offer assistance prn and inquired if [facility] RN CM had her permission to speak with Mr Renbarger if she was not able to speak with [facility] RN CM  THN RN CM dicussed possibly having Mrs Abraha to return a call to [facility] RN CM today  Mrs Grosman disconnected the call   Conditions MS (multiple sclerosis) chronic respiratory failure with hypoxia, wedge compression fracture of T11-T12 vertebra subsequent, Other fracture of sacrum, subsequent encounter for fracture with routine healing, chronic pain, acute cystitis without hematuria, Hypertension (HTN), fibromyalgia, iron deficiency anemia, history (hx) of falling, viral intestinal infection. Rheumatoid Arthritis (RA)   Plan: [facility] RN CM sent an unsuccessful outreach letter to this [facility] engaged patient and scheduled this patient for another call attempt within 4-7 business days Routed note to [place]. Lavina Hamman, RN,  BSN, [place] Management Care Coordinator Direct Number [phone] Mobile number [phone]  Main THN number [phone] Fax number [phone]

## 2019-07-15 DIAGNOSIS — R519 Headache, unspecified: Secondary | ICD-10-CM | POA: Diagnosis not present

## 2019-07-17 DIAGNOSIS — S22000A Wedge compression fracture of unspecified thoracic vertebra, initial encounter for closed fracture: Secondary | ICD-10-CM | POA: Diagnosis not present

## 2019-07-17 DIAGNOSIS — G35 Multiple sclerosis: Secondary | ICD-10-CM | POA: Diagnosis not present

## 2019-07-19 DIAGNOSIS — M797 Fibromyalgia: Secondary | ICD-10-CM | POA: Diagnosis not present

## 2019-07-19 DIAGNOSIS — J301 Allergic rhinitis due to pollen: Secondary | ICD-10-CM | POA: Diagnosis not present

## 2019-07-19 DIAGNOSIS — I744 Embolism and thrombosis of arteries of extremities, unspecified: Secondary | ICD-10-CM | POA: Diagnosis not present

## 2019-07-19 DIAGNOSIS — E782 Mixed hyperlipidemia: Secondary | ICD-10-CM | POA: Diagnosis not present

## 2019-07-19 DIAGNOSIS — G35 Multiple sclerosis: Secondary | ICD-10-CM | POA: Diagnosis not present

## 2019-07-19 DIAGNOSIS — I10 Essential (primary) hypertension: Secondary | ICD-10-CM | POA: Diagnosis not present

## 2019-07-19 DIAGNOSIS — D649 Anemia, unspecified: Secondary | ICD-10-CM | POA: Diagnosis not present

## 2019-07-19 DIAGNOSIS — M8008XD Age-related osteoporosis with current pathological fracture, vertebra(e), subsequent encounter for fracture with routine healing: Secondary | ICD-10-CM | POA: Diagnosis not present

## 2019-07-20 DIAGNOSIS — S22058D Other fracture of T5-T6 vertebra, subsequent encounter for fracture with routine healing: Secondary | ICD-10-CM | POA: Diagnosis not present

## 2019-07-21 ENCOUNTER — Other Ambulatory Visit: Payer: Self-pay | Admitting: *Deleted

## 2019-07-21 NOTE — Patient Outreach (Signed)
[facility]) Care Management  07/21/2019  ERSIE SAVINO 1940-04-16 [id number]  THNoutreach to  skilled nursing facility (snf)Transition of Care Referred patient  Referral Date:06/21/19 Referral Source:Atika H MSN-Ed, RN, BSN [facility]) coordinator Date of [place] care cent 05/27/19 to 06/17/19   Reason for consult Please assign to [place]  Diagnoses of Other   Other Diagnosis: MS, HTN, depression, OA, RA, GERD   Please assign to [place] for complex case management and care coordination. Dc from [facility] on 06/17/19. Agreeable to [facility] CM services. Will confirm with facility SW on name of home health agency.  Insurance:[employer]   Outreach attempt unsuccessful  No answer. THN RN CM unable to leave a HIPAA ([place]) compliant voicemail message along with CM's contact info. Phone rang for over a minute   ConditionsMS (multiple sclerosis)chronic respiratory failure with hypoxia, wedge compression fracture of T11-T12 vertebra subsequent,Other fracture of sacrum, subsequent encounter for fracture with routine healing, chronic pain, acute cystitis without hematuria,Hypertension (HTN), fibromyalgia, iron deficiency anemia,history (hx)of falling, viral intestinal infection.Rheumatoid Arthritis (RA)  Plan: [facility] RN CM hs attempted to maintain contact with Mrs Benninger without success to include leaving messages for her, sent an unsuccessful outreach letter on 07/14/19 and scheduled this patient for case closure if no response within 10 business days  Routed note to MD   Jackson. Lavina Hamman, RN, BSN, [place] Coordinator Office number [phone] Mobile number [phone]  Main THN number [phone] Fax number [phone]

## 2019-07-22 DIAGNOSIS — J329 Chronic sinusitis, unspecified: Secondary | ICD-10-CM | POA: Diagnosis not present

## 2019-07-25 DIAGNOSIS — S139XXA Sprain of joints and ligaments of unspecified parts of neck, initial encounter: Secondary | ICD-10-CM | POA: Diagnosis not present

## 2019-07-25 DIAGNOSIS — R531 Weakness: Secondary | ICD-10-CM | POA: Diagnosis not present

## 2019-08-04 ENCOUNTER — Other Ambulatory Visit: Payer: Self-pay | Admitting: *Deleted

## 2019-08-04 NOTE — Patient Outreach (Signed)
[facility]) Care Management  08/04/2019  [name] 1940/05/10 [id number]   THN Case closure -unable to maintain contact, eligible but no participation  Stephanie Perez was referred to [place] on 06/21/19 by Pollyann Samples MSN-Ed, RN, BSN [facility]) coordinator after discharge from [facility] SNF on 06/17/19 Other Diagnosis: MS, HTN, depression, OA, RA, GERD   Insurance: [employer]   Conditions MS (multiple sclerosis) chronic respiratory failure with hypoxia, wedge compression fracture of T11-T12 vertebra subsequent, Other fracture of sacrum, subsequent encounter for fracture with routine healing, chronic pain, acute cystitis without hematuria, Hypertension (HTN), fibromyalgia, iron deficiency anemia, history (hx) of falling, viral intestinal infection. Rheumatoid Arthritis (RA)  Initial partial successful outreach on 07/05/19 Patient assisted with local personal care services   Call attempts made on 07/13/19, 07/14/19 and 07/21/19 Unsuccessful outreach letter sent on 07/14/19 without a response   Plan [facility] RN CM will close case after no response from patient within 10 business days. unable to maintain contact, eligible but no participation Case closure letters sent to patient and MD  [facility] CM Care Plan Problem One     Most Recent Value  Care Plan Problem One Knowledge deficit of home care for MS, HTN  Role Documenting the Problem One Care Management Telephonic Coordinator  Care Plan for Problem One Active  THN Long Term Goal  over the next 90 days patient will be ale to verbalize with outreach interventions to manage MS adn HTN at home  [name] Goal Start Date 07/05/19  [facility] Long Term Goal Met Date 08/04/19  [facility] CM Short Term Goal #1  patient will be able to verbalize in the next 21 days that services are available to assist her with home care needs  [facility] CM Short Term Goal #1 Start Date 07/05/19  [facility] CM Short Term Goal #1 Met Date 08/04/19       Joelene Millin L. Lavina Hamman, RN, BSN, [place] Coordinator Office number [phone] Mobile number [phone]  Main THN number [phone] Fax number [phone]

## 2019-08-11 DIAGNOSIS — M546 Pain in thoracic spine: Secondary | ICD-10-CM | POA: Diagnosis not present

## 2019-08-24 DIAGNOSIS — F331 Major depressive disorder, recurrent, moderate: Secondary | ICD-10-CM | POA: Diagnosis not present

## 2019-08-24 DIAGNOSIS — M545 Low back pain: Secondary | ICD-10-CM | POA: Diagnosis not present

## 2019-08-24 DIAGNOSIS — Z6822 Body mass index (BMI) 22.0-22.9, adult: Secondary | ICD-10-CM | POA: Diagnosis not present

## 2019-08-25 DIAGNOSIS — M545 Low back pain: Secondary | ICD-10-CM | POA: Diagnosis not present

## 2019-08-25 DIAGNOSIS — G35 Multiple sclerosis: Secondary | ICD-10-CM | POA: Diagnosis not present

## 2019-08-25 DIAGNOSIS — F331 Major depressive disorder, recurrent, moderate: Secondary | ICD-10-CM | POA: Diagnosis not present

## 2019-08-25 DIAGNOSIS — Z6822 Body mass index (BMI) 22.0-22.9, adult: Secondary | ICD-10-CM | POA: Diagnosis not present

## 2019-08-31 DIAGNOSIS — S22080A Wedge compression fracture of T11-T12 vertebra, initial encounter for closed fracture: Secondary | ICD-10-CM | POA: Diagnosis not present

## 2019-08-31 DIAGNOSIS — S22050A Wedge compression fracture of T5-T6 vertebra, initial encounter for closed fracture: Secondary | ICD-10-CM | POA: Diagnosis not present

## 2019-08-31 DIAGNOSIS — M5126 Other intervertebral disc displacement, lumbar region: Secondary | ICD-10-CM | POA: Diagnosis not present

## 2019-08-31 DIAGNOSIS — M47816 Spondylosis without myelopathy or radiculopathy, lumbar region: Secondary | ICD-10-CM | POA: Diagnosis not present

## 2019-08-31 DIAGNOSIS — X58XXXA Exposure to other specified factors, initial encounter: Secondary | ICD-10-CM | POA: Diagnosis not present

## 2019-08-31 DIAGNOSIS — M48061 Spinal stenosis, lumbar region without neurogenic claudication: Secondary | ICD-10-CM | POA: Diagnosis not present

## 2019-08-31 DIAGNOSIS — S22068A Other fracture of T7-T8 thoracic vertebra, initial encounter for closed fracture: Secondary | ICD-10-CM | POA: Diagnosis not present

## 2019-09-08 DIAGNOSIS — S22080A Wedge compression fracture of T11-T12 vertebra, initial encounter for closed fracture: Secondary | ICD-10-CM | POA: Diagnosis not present

## 2019-09-08 DIAGNOSIS — S22060A Wedge compression fracture of T7-T8 vertebra, initial encounter for closed fracture: Secondary | ICD-10-CM | POA: Diagnosis not present

## 2019-09-08 DIAGNOSIS — S22050A Wedge compression fracture of T5-T6 vertebra, initial encounter for closed fracture: Secondary | ICD-10-CM | POA: Diagnosis not present

## 2019-09-09 DIAGNOSIS — G35 Multiple sclerosis: Secondary | ICD-10-CM | POA: Diagnosis not present

## 2019-09-09 DIAGNOSIS — I10 Essential (primary) hypertension: Secondary | ICD-10-CM | POA: Diagnosis not present

## 2019-09-09 DIAGNOSIS — D500 Iron deficiency anemia secondary to blood loss (chronic): Secondary | ICD-10-CM | POA: Diagnosis not present

## 2019-09-09 DIAGNOSIS — E7849 Other hyperlipidemia: Secondary | ICD-10-CM | POA: Diagnosis not present

## 2019-09-23 DIAGNOSIS — I10 Essential (primary) hypertension: Secondary | ICD-10-CM | POA: Diagnosis not present

## 2019-09-23 DIAGNOSIS — E782 Mixed hyperlipidemia: Secondary | ICD-10-CM | POA: Diagnosis not present

## 2019-09-23 DIAGNOSIS — G35 Multiple sclerosis: Secondary | ICD-10-CM | POA: Diagnosis not present

## 2019-09-23 DIAGNOSIS — R5383 Other fatigue: Secondary | ICD-10-CM | POA: Diagnosis not present

## 2019-09-23 DIAGNOSIS — M797 Fibromyalgia: Secondary | ICD-10-CM | POA: Diagnosis not present

## 2019-09-27 DIAGNOSIS — D500 Iron deficiency anemia secondary to blood loss (chronic): Secondary | ICD-10-CM | POA: Diagnosis not present

## 2019-09-27 DIAGNOSIS — M05419 Rheumatoid myopathy with rheumatoid arthritis of unspecified shoulder: Secondary | ICD-10-CM | POA: Diagnosis not present

## 2019-09-27 DIAGNOSIS — I10 Essential (primary) hypertension: Secondary | ICD-10-CM | POA: Diagnosis not present

## 2019-09-27 DIAGNOSIS — S22000A Wedge compression fracture of unspecified thoracic vertebra, initial encounter for closed fracture: Secondary | ICD-10-CM | POA: Diagnosis not present

## 2019-09-27 DIAGNOSIS — F331 Major depressive disorder, recurrent, moderate: Secondary | ICD-10-CM | POA: Diagnosis not present

## 2019-09-27 DIAGNOSIS — G35 Multiple sclerosis: Secondary | ICD-10-CM | POA: Diagnosis not present

## 2019-09-27 DIAGNOSIS — E782 Mixed hyperlipidemia: Secondary | ICD-10-CM | POA: Diagnosis not present

## 2019-09-27 DIAGNOSIS — R4582 Worries: Secondary | ICD-10-CM | POA: Diagnosis not present

## 2019-10-04 DIAGNOSIS — M545 Low back pain: Secondary | ICD-10-CM | POA: Diagnosis not present

## 2019-10-11 DIAGNOSIS — E7849 Other hyperlipidemia: Secondary | ICD-10-CM | POA: Diagnosis not present

## 2019-10-11 DIAGNOSIS — S22050D Wedge compression fracture of T5-T6 vertebra, subsequent encounter for fracture with routine healing: Secondary | ICD-10-CM | POA: Diagnosis not present

## 2019-10-11 DIAGNOSIS — G35 Multiple sclerosis: Secondary | ICD-10-CM | POA: Diagnosis not present

## 2019-10-11 DIAGNOSIS — D500 Iron deficiency anemia secondary to blood loss (chronic): Secondary | ICD-10-CM | POA: Diagnosis not present

## 2019-10-11 DIAGNOSIS — I10 Essential (primary) hypertension: Secondary | ICD-10-CM | POA: Diagnosis not present

## 2019-10-21 DIAGNOSIS — M47816 Spondylosis without myelopathy or radiculopathy, lumbar region: Secondary | ICD-10-CM | POA: Diagnosis not present

## 2019-10-21 DIAGNOSIS — M5126 Other intervertebral disc displacement, lumbar region: Secondary | ICD-10-CM | POA: Diagnosis not present

## 2019-10-28 DIAGNOSIS — L03116 Cellulitis of left lower limb: Secondary | ICD-10-CM | POA: Diagnosis not present

## 2019-10-28 DIAGNOSIS — Z23 Encounter for immunization: Secondary | ICD-10-CM | POA: Diagnosis not present

## 2019-11-05 DIAGNOSIS — L03119 Cellulitis of unspecified part of limb: Secondary | ICD-10-CM | POA: Diagnosis not present

## 2019-11-09 DIAGNOSIS — M47816 Spondylosis without myelopathy or radiculopathy, lumbar region: Secondary | ICD-10-CM | POA: Diagnosis not present

## 2019-11-09 DIAGNOSIS — M05419 Rheumatoid myopathy with rheumatoid arthritis of unspecified shoulder: Secondary | ICD-10-CM | POA: Diagnosis not present

## 2019-11-09 DIAGNOSIS — Z9181 History of falling: Secondary | ICD-10-CM | POA: Diagnosis not present

## 2019-11-09 DIAGNOSIS — G35 Multiple sclerosis: Secondary | ICD-10-CM | POA: Diagnosis not present

## 2019-11-09 DIAGNOSIS — F331 Major depressive disorder, recurrent, moderate: Secondary | ICD-10-CM | POA: Diagnosis not present

## 2019-11-09 DIAGNOSIS — M810 Age-related osteoporosis without current pathological fracture: Secondary | ICD-10-CM | POA: Diagnosis not present

## 2019-11-09 DIAGNOSIS — I10 Essential (primary) hypertension: Secondary | ICD-10-CM | POA: Diagnosis not present

## 2019-11-09 DIAGNOSIS — E782 Mixed hyperlipidemia: Secondary | ICD-10-CM | POA: Diagnosis not present

## 2019-11-09 DIAGNOSIS — D500 Iron deficiency anemia secondary to blood loss (chronic): Secondary | ICD-10-CM | POA: Diagnosis not present

## 2019-11-09 DIAGNOSIS — M1711 Unilateral primary osteoarthritis, right knee: Secondary | ICD-10-CM | POA: Diagnosis not present

## 2019-11-09 DIAGNOSIS — S81812D Laceration without foreign body, left lower leg, subsequent encounter: Secondary | ICD-10-CM | POA: Diagnosis not present

## 2019-11-09 DIAGNOSIS — M797 Fibromyalgia: Secondary | ICD-10-CM | POA: Diagnosis not present

## 2019-11-09 DIAGNOSIS — L03116 Cellulitis of left lower limb: Secondary | ICD-10-CM | POA: Diagnosis not present

## 2019-11-10 DIAGNOSIS — I10 Essential (primary) hypertension: Secondary | ICD-10-CM | POA: Diagnosis not present

## 2019-11-10 DIAGNOSIS — G35 Multiple sclerosis: Secondary | ICD-10-CM | POA: Diagnosis not present

## 2019-11-10 DIAGNOSIS — E7849 Other hyperlipidemia: Secondary | ICD-10-CM | POA: Diagnosis not present

## 2019-11-10 DIAGNOSIS — D500 Iron deficiency anemia secondary to blood loss (chronic): Secondary | ICD-10-CM | POA: Diagnosis not present

## 2019-11-17 DIAGNOSIS — I10 Essential (primary) hypertension: Secondary | ICD-10-CM | POA: Diagnosis not present

## 2019-11-17 DIAGNOSIS — L03116 Cellulitis of left lower limb: Secondary | ICD-10-CM | POA: Diagnosis not present

## 2019-11-17 DIAGNOSIS — G35 Multiple sclerosis: Secondary | ICD-10-CM | POA: Diagnosis not present

## 2019-11-17 DIAGNOSIS — S81812D Laceration without foreign body, left lower leg, subsequent encounter: Secondary | ICD-10-CM | POA: Diagnosis not present

## 2019-11-17 DIAGNOSIS — M1711 Unilateral primary osteoarthritis, right knee: Secondary | ICD-10-CM | POA: Diagnosis not present

## 2019-11-17 DIAGNOSIS — M47816 Spondylosis without myelopathy or radiculopathy, lumbar region: Secondary | ICD-10-CM | POA: Diagnosis not present

## 2019-11-21 DIAGNOSIS — M1711 Unilateral primary osteoarthritis, right knee: Secondary | ICD-10-CM | POA: Diagnosis not present

## 2019-11-21 DIAGNOSIS — G35 Multiple sclerosis: Secondary | ICD-10-CM | POA: Diagnosis not present

## 2019-11-21 DIAGNOSIS — L03116 Cellulitis of left lower limb: Secondary | ICD-10-CM | POA: Diagnosis not present

## 2019-11-21 DIAGNOSIS — I10 Essential (primary) hypertension: Secondary | ICD-10-CM | POA: Diagnosis not present

## 2019-11-21 DIAGNOSIS — S81812D Laceration without foreign body, left lower leg, subsequent encounter: Secondary | ICD-10-CM | POA: Diagnosis not present

## 2019-11-21 DIAGNOSIS — D500 Iron deficiency anemia secondary to blood loss (chronic): Secondary | ICD-10-CM | POA: Diagnosis not present

## 2019-11-21 DIAGNOSIS — M797 Fibromyalgia: Secondary | ICD-10-CM | POA: Diagnosis not present

## 2019-11-21 DIAGNOSIS — M47816 Spondylosis without myelopathy or radiculopathy, lumbar region: Secondary | ICD-10-CM | POA: Diagnosis not present

## 2019-11-22 DIAGNOSIS — H43392 Other vitreous opacities, left eye: Secondary | ICD-10-CM | POA: Diagnosis not present

## 2019-11-22 DIAGNOSIS — G35 Multiple sclerosis: Secondary | ICD-10-CM | POA: Diagnosis not present

## 2019-11-24 DIAGNOSIS — M79605 Pain in left leg: Secondary | ICD-10-CM | POA: Diagnosis not present

## 2019-11-24 DIAGNOSIS — I739 Peripheral vascular disease, unspecified: Secondary | ICD-10-CM | POA: Diagnosis not present

## 2019-11-24 DIAGNOSIS — I8392 Asymptomatic varicose veins of left lower extremity: Secondary | ICD-10-CM | POA: Diagnosis not present

## 2019-11-24 DIAGNOSIS — R600 Localized edema: Secondary | ICD-10-CM | POA: Diagnosis not present

## 2019-11-24 DIAGNOSIS — I2510 Atherosclerotic heart disease of native coronary artery without angina pectoris: Secondary | ICD-10-CM | POA: Diagnosis not present

## 2019-11-24 DIAGNOSIS — I493 Ventricular premature depolarization: Secondary | ICD-10-CM | POA: Diagnosis not present

## 2019-11-24 DIAGNOSIS — I351 Nonrheumatic aortic (valve) insufficiency: Secondary | ICD-10-CM | POA: Diagnosis not present

## 2019-11-24 DIAGNOSIS — M79604 Pain in right leg: Secondary | ICD-10-CM | POA: Diagnosis not present

## 2019-11-24 DIAGNOSIS — I10 Essential (primary) hypertension: Secondary | ICD-10-CM | POA: Diagnosis not present

## 2019-11-24 DIAGNOSIS — I447 Left bundle-branch block, unspecified: Secondary | ICD-10-CM | POA: Diagnosis not present

## 2019-11-29 DIAGNOSIS — M546 Pain in thoracic spine: Secondary | ICD-10-CM | POA: Diagnosis not present

## 2019-11-29 DIAGNOSIS — M545 Low back pain, unspecified: Secondary | ICD-10-CM | POA: Diagnosis not present

## 2019-11-30 DIAGNOSIS — S81812D Laceration without foreign body, left lower leg, subsequent encounter: Secondary | ICD-10-CM | POA: Diagnosis not present

## 2019-11-30 DIAGNOSIS — M47816 Spondylosis without myelopathy or radiculopathy, lumbar region: Secondary | ICD-10-CM | POA: Diagnosis not present

## 2019-11-30 DIAGNOSIS — I10 Essential (primary) hypertension: Secondary | ICD-10-CM | POA: Diagnosis not present

## 2019-11-30 DIAGNOSIS — G35 Multiple sclerosis: Secondary | ICD-10-CM | POA: Diagnosis not present

## 2019-11-30 DIAGNOSIS — L03116 Cellulitis of left lower limb: Secondary | ICD-10-CM | POA: Diagnosis not present

## 2019-11-30 DIAGNOSIS — M1711 Unilateral primary osteoarthritis, right knee: Secondary | ICD-10-CM | POA: Diagnosis not present

## 2019-12-06 DIAGNOSIS — Z888 Allergy status to other drugs, medicaments and biological substances status: Secondary | ICD-10-CM | POA: Diagnosis not present

## 2019-12-06 DIAGNOSIS — M47816 Spondylosis without myelopathy or radiculopathy, lumbar region: Secondary | ICD-10-CM | POA: Diagnosis not present

## 2019-12-06 DIAGNOSIS — A419 Sepsis, unspecified organism: Secondary | ICD-10-CM | POA: Diagnosis not present

## 2019-12-06 DIAGNOSIS — W050XXA Fall from non-moving wheelchair, initial encounter: Secondary | ICD-10-CM | POA: Diagnosis not present

## 2019-12-06 DIAGNOSIS — Z885 Allergy status to narcotic agent status: Secondary | ICD-10-CM | POA: Diagnosis not present

## 2019-12-06 DIAGNOSIS — M1711 Unilateral primary osteoarthritis, right knee: Secondary | ICD-10-CM | POA: Diagnosis not present

## 2019-12-06 DIAGNOSIS — M797 Fibromyalgia: Secondary | ICD-10-CM | POA: Diagnosis not present

## 2019-12-06 DIAGNOSIS — Z882 Allergy status to sulfonamides status: Secondary | ICD-10-CM | POA: Diagnosis not present

## 2019-12-06 DIAGNOSIS — S81812D Laceration without foreign body, left lower leg, subsequent encounter: Secondary | ICD-10-CM | POA: Diagnosis not present

## 2019-12-06 DIAGNOSIS — M5030 Other cervical disc degeneration, unspecified cervical region: Secondary | ICD-10-CM | POA: Diagnosis not present

## 2019-12-06 DIAGNOSIS — J320 Chronic maxillary sinusitis: Secondary | ICD-10-CM | POA: Diagnosis not present

## 2019-12-06 DIAGNOSIS — R52 Pain, unspecified: Secondary | ICD-10-CM | POA: Diagnosis not present

## 2019-12-06 DIAGNOSIS — W19XXXA Unspecified fall, initial encounter: Secondary | ICD-10-CM | POA: Diagnosis not present

## 2019-12-06 DIAGNOSIS — Y9389 Activity, other specified: Secondary | ICD-10-CM | POA: Diagnosis not present

## 2019-12-06 DIAGNOSIS — L03116 Cellulitis of left lower limb: Secondary | ICD-10-CM | POA: Diagnosis not present

## 2019-12-06 DIAGNOSIS — I10 Essential (primary) hypertension: Secondary | ICD-10-CM | POA: Diagnosis not present

## 2019-12-06 DIAGNOSIS — R519 Headache, unspecified: Secondary | ICD-10-CM | POA: Diagnosis not present

## 2019-12-06 DIAGNOSIS — Y92512 Supermarket, store or market as the place of occurrence of the external cause: Secondary | ICD-10-CM | POA: Diagnosis not present

## 2019-12-06 DIAGNOSIS — G35 Multiple sclerosis: Secondary | ICD-10-CM | POA: Diagnosis not present

## 2019-12-08 DIAGNOSIS — S0512XA Contusion of eyeball and orbital tissues, left eye, initial encounter: Secondary | ICD-10-CM | POA: Diagnosis not present

## 2019-12-08 DIAGNOSIS — H1132 Conjunctival hemorrhage, left eye: Secondary | ICD-10-CM | POA: Diagnosis not present

## 2019-12-09 DIAGNOSIS — M05419 Rheumatoid myopathy with rheumatoid arthritis of unspecified shoulder: Secondary | ICD-10-CM | POA: Diagnosis not present

## 2019-12-09 DIAGNOSIS — E782 Mixed hyperlipidemia: Secondary | ICD-10-CM | POA: Diagnosis not present

## 2019-12-09 DIAGNOSIS — S81812D Laceration without foreign body, left lower leg, subsequent encounter: Secondary | ICD-10-CM | POA: Diagnosis not present

## 2019-12-09 DIAGNOSIS — D500 Iron deficiency anemia secondary to blood loss (chronic): Secondary | ICD-10-CM | POA: Diagnosis not present

## 2019-12-09 DIAGNOSIS — F331 Major depressive disorder, recurrent, moderate: Secondary | ICD-10-CM | POA: Diagnosis not present

## 2019-12-09 DIAGNOSIS — M1711 Unilateral primary osteoarthritis, right knee: Secondary | ICD-10-CM | POA: Diagnosis not present

## 2019-12-09 DIAGNOSIS — M47816 Spondylosis without myelopathy or radiculopathy, lumbar region: Secondary | ICD-10-CM | POA: Diagnosis not present

## 2019-12-09 DIAGNOSIS — G35 Multiple sclerosis: Secondary | ICD-10-CM | POA: Diagnosis not present

## 2019-12-09 DIAGNOSIS — M810 Age-related osteoporosis without current pathological fracture: Secondary | ICD-10-CM | POA: Diagnosis not present

## 2019-12-09 DIAGNOSIS — L03116 Cellulitis of left lower limb: Secondary | ICD-10-CM | POA: Diagnosis not present

## 2019-12-09 DIAGNOSIS — I10 Essential (primary) hypertension: Secondary | ICD-10-CM | POA: Diagnosis not present

## 2019-12-09 DIAGNOSIS — Z9181 History of falling: Secondary | ICD-10-CM | POA: Diagnosis not present

## 2019-12-09 DIAGNOSIS — M797 Fibromyalgia: Secondary | ICD-10-CM | POA: Diagnosis not present

## 2019-12-10 DIAGNOSIS — I10 Essential (primary) hypertension: Secondary | ICD-10-CM | POA: Diagnosis not present

## 2019-12-10 DIAGNOSIS — G35 Multiple sclerosis: Secondary | ICD-10-CM | POA: Diagnosis not present

## 2019-12-10 DIAGNOSIS — E7849 Other hyperlipidemia: Secondary | ICD-10-CM | POA: Diagnosis not present

## 2019-12-13 DIAGNOSIS — I10 Essential (primary) hypertension: Secondary | ICD-10-CM | POA: Diagnosis not present

## 2019-12-13 DIAGNOSIS — M1711 Unilateral primary osteoarthritis, right knee: Secondary | ICD-10-CM | POA: Diagnosis not present

## 2019-12-13 DIAGNOSIS — S81812D Laceration without foreign body, left lower leg, subsequent encounter: Secondary | ICD-10-CM | POA: Diagnosis not present

## 2019-12-13 DIAGNOSIS — G35 Multiple sclerosis: Secondary | ICD-10-CM | POA: Diagnosis not present

## 2019-12-13 DIAGNOSIS — M47816 Spondylosis without myelopathy or radiculopathy, lumbar region: Secondary | ICD-10-CM | POA: Diagnosis not present

## 2019-12-13 DIAGNOSIS — L03116 Cellulitis of left lower limb: Secondary | ICD-10-CM | POA: Diagnosis not present

## 2019-12-14 DIAGNOSIS — S060X9A Concussion with loss of consciousness of unspecified duration, initial encounter: Secondary | ICD-10-CM | POA: Diagnosis not present

## 2019-12-14 DIAGNOSIS — H8110 Benign paroxysmal vertigo, unspecified ear: Secondary | ICD-10-CM | POA: Diagnosis not present

## 2019-12-16 DIAGNOSIS — M47816 Spondylosis without myelopathy or radiculopathy, lumbar region: Secondary | ICD-10-CM | POA: Diagnosis not present

## 2019-12-16 DIAGNOSIS — G35 Multiple sclerosis: Secondary | ICD-10-CM | POA: Diagnosis not present

## 2019-12-16 DIAGNOSIS — L03116 Cellulitis of left lower limb: Secondary | ICD-10-CM | POA: Diagnosis not present

## 2019-12-16 DIAGNOSIS — M1711 Unilateral primary osteoarthritis, right knee: Secondary | ICD-10-CM | POA: Diagnosis not present

## 2019-12-16 DIAGNOSIS — I10 Essential (primary) hypertension: Secondary | ICD-10-CM | POA: Diagnosis not present

## 2019-12-16 DIAGNOSIS — S81812D Laceration without foreign body, left lower leg, subsequent encounter: Secondary | ICD-10-CM | POA: Diagnosis not present

## 2019-12-17 DIAGNOSIS — I351 Nonrheumatic aortic (valve) insufficiency: Secondary | ICD-10-CM | POA: Diagnosis not present

## 2019-12-21 DIAGNOSIS — I10 Essential (primary) hypertension: Secondary | ICD-10-CM | POA: Diagnosis not present

## 2019-12-21 DIAGNOSIS — M47816 Spondylosis without myelopathy or radiculopathy, lumbar region: Secondary | ICD-10-CM | POA: Diagnosis not present

## 2019-12-21 DIAGNOSIS — L03116 Cellulitis of left lower limb: Secondary | ICD-10-CM | POA: Diagnosis not present

## 2019-12-21 DIAGNOSIS — S81812D Laceration without foreign body, left lower leg, subsequent encounter: Secondary | ICD-10-CM | POA: Diagnosis not present

## 2019-12-21 DIAGNOSIS — G35 Multiple sclerosis: Secondary | ICD-10-CM | POA: Diagnosis not present

## 2019-12-21 DIAGNOSIS — M1711 Unilateral primary osteoarthritis, right knee: Secondary | ICD-10-CM | POA: Diagnosis not present

## 2019-12-27 DIAGNOSIS — L03116 Cellulitis of left lower limb: Secondary | ICD-10-CM | POA: Diagnosis not present

## 2019-12-27 DIAGNOSIS — S81812D Laceration without foreign body, left lower leg, subsequent encounter: Secondary | ICD-10-CM | POA: Diagnosis not present

## 2019-12-27 DIAGNOSIS — M47816 Spondylosis without myelopathy or radiculopathy, lumbar region: Secondary | ICD-10-CM | POA: Diagnosis not present

## 2019-12-27 DIAGNOSIS — M1711 Unilateral primary osteoarthritis, right knee: Secondary | ICD-10-CM | POA: Diagnosis not present

## 2019-12-27 DIAGNOSIS — G35 Multiple sclerosis: Secondary | ICD-10-CM | POA: Diagnosis not present

## 2019-12-27 DIAGNOSIS — I10 Essential (primary) hypertension: Secondary | ICD-10-CM | POA: Diagnosis not present

## 2019-12-28 DIAGNOSIS — W19XXXA Unspecified fall, initial encounter: Secondary | ICD-10-CM | POA: Diagnosis not present

## 2019-12-28 DIAGNOSIS — M25552 Pain in left hip: Secondary | ICD-10-CM | POA: Diagnosis not present

## 2019-12-28 DIAGNOSIS — S2241XA Multiple fractures of ribs, right side, initial encounter for closed fracture: Secondary | ICD-10-CM | POA: Diagnosis not present

## 2019-12-28 DIAGNOSIS — Y92009 Unspecified place in unspecified non-institutional (private) residence as the place of occurrence of the external cause: Secondary | ICD-10-CM | POA: Diagnosis not present

## 2019-12-28 DIAGNOSIS — R0781 Pleurodynia: Secondary | ICD-10-CM | POA: Diagnosis not present

## 2019-12-28 DIAGNOSIS — M25551 Pain in right hip: Secondary | ICD-10-CM | POA: Diagnosis not present

## 2019-12-29 DIAGNOSIS — M1711 Unilateral primary osteoarthritis, right knee: Secondary | ICD-10-CM | POA: Diagnosis not present

## 2019-12-29 DIAGNOSIS — M47816 Spondylosis without myelopathy or radiculopathy, lumbar region: Secondary | ICD-10-CM | POA: Diagnosis not present

## 2019-12-29 DIAGNOSIS — G35 Multiple sclerosis: Secondary | ICD-10-CM | POA: Diagnosis not present

## 2019-12-29 DIAGNOSIS — S81812D Laceration without foreign body, left lower leg, subsequent encounter: Secondary | ICD-10-CM | POA: Diagnosis not present

## 2019-12-29 DIAGNOSIS — L03116 Cellulitis of left lower limb: Secondary | ICD-10-CM | POA: Diagnosis not present

## 2019-12-29 DIAGNOSIS — I10 Essential (primary) hypertension: Secondary | ICD-10-CM | POA: Diagnosis not present

## 2020-01-04 DIAGNOSIS — M47816 Spondylosis without myelopathy or radiculopathy, lumbar region: Secondary | ICD-10-CM | POA: Diagnosis not present

## 2020-01-04 DIAGNOSIS — I10 Essential (primary) hypertension: Secondary | ICD-10-CM | POA: Diagnosis not present

## 2020-01-04 DIAGNOSIS — G35 Multiple sclerosis: Secondary | ICD-10-CM | POA: Diagnosis not present

## 2020-01-04 DIAGNOSIS — S81812D Laceration without foreign body, left lower leg, subsequent encounter: Secondary | ICD-10-CM | POA: Diagnosis not present

## 2020-01-04 DIAGNOSIS — M1711 Unilateral primary osteoarthritis, right knee: Secondary | ICD-10-CM | POA: Diagnosis not present

## 2020-01-04 DIAGNOSIS — L03116 Cellulitis of left lower limb: Secondary | ICD-10-CM | POA: Diagnosis not present

## 2020-01-10 DIAGNOSIS — I10 Essential (primary) hypertension: Secondary | ICD-10-CM | POA: Diagnosis not present

## 2020-01-10 DIAGNOSIS — E7849 Other hyperlipidemia: Secondary | ICD-10-CM | POA: Diagnosis not present

## 2020-01-10 DIAGNOSIS — G35 Multiple sclerosis: Secondary | ICD-10-CM | POA: Diagnosis not present

## 2020-01-11 DIAGNOSIS — M542 Cervicalgia: Secondary | ICD-10-CM | POA: Diagnosis not present

## 2020-01-11 DIAGNOSIS — I10 Essential (primary) hypertension: Secondary | ICD-10-CM | POA: Diagnosis not present

## 2020-01-11 DIAGNOSIS — Z0001 Encounter for general adult medical examination with abnormal findings: Secondary | ICD-10-CM | POA: Diagnosis not present

## 2020-01-11 DIAGNOSIS — S22000A Wedge compression fracture of unspecified thoracic vertebra, initial encounter for closed fracture: Secondary | ICD-10-CM | POA: Diagnosis not present

## 2020-01-11 DIAGNOSIS — R4582 Worries: Secondary | ICD-10-CM | POA: Diagnosis not present

## 2020-01-11 DIAGNOSIS — F331 Major depressive disorder, recurrent, moderate: Secondary | ICD-10-CM | POA: Diagnosis not present

## 2020-01-11 DIAGNOSIS — D500 Iron deficiency anemia secondary to blood loss (chronic): Secondary | ICD-10-CM | POA: Diagnosis not present

## 2020-01-11 DIAGNOSIS — Z9181 History of falling: Secondary | ICD-10-CM | POA: Diagnosis not present

## 2020-01-11 DIAGNOSIS — E782 Mixed hyperlipidemia: Secondary | ICD-10-CM | POA: Diagnosis not present

## 2020-01-11 DIAGNOSIS — M05419 Rheumatoid myopathy with rheumatoid arthritis of unspecified shoulder: Secondary | ICD-10-CM | POA: Diagnosis not present

## 2020-01-13 DIAGNOSIS — M2578 Osteophyte, vertebrae: Secondary | ICD-10-CM | POA: Diagnosis not present

## 2020-01-13 DIAGNOSIS — G959 Disease of spinal cord, unspecified: Secondary | ICD-10-CM | POA: Diagnosis not present

## 2020-01-13 DIAGNOSIS — G9589 Other specified diseases of spinal cord: Secondary | ICD-10-CM | POA: Diagnosis not present

## 2020-01-13 DIAGNOSIS — G35 Multiple sclerosis: Secondary | ICD-10-CM | POA: Diagnosis not present

## 2020-01-13 DIAGNOSIS — M9971 Connective tissue and disc stenosis of intervertebral foramina of cervical region: Secondary | ICD-10-CM | POA: Diagnosis not present

## 2020-01-13 DIAGNOSIS — M47812 Spondylosis without myelopathy or radiculopathy, cervical region: Secondary | ICD-10-CM | POA: Diagnosis not present

## 2020-01-13 DIAGNOSIS — R9389 Abnormal findings on diagnostic imaging of other specified body structures: Secondary | ICD-10-CM | POA: Diagnosis not present

## 2020-01-17 DIAGNOSIS — M47812 Spondylosis without myelopathy or radiculopathy, cervical region: Secondary | ICD-10-CM | POA: Diagnosis not present

## 2020-01-17 DIAGNOSIS — S12100K Unspecified displaced fracture of second cervical vertebra, subsequent encounter for fracture with nonunion: Secondary | ICD-10-CM | POA: Diagnosis not present

## 2020-01-17 DIAGNOSIS — S12190K Other displaced fracture of second cervical vertebra, subsequent encounter for fracture with nonunion: Secondary | ICD-10-CM | POA: Diagnosis not present

## 2020-01-17 DIAGNOSIS — M2578 Osteophyte, vertebrae: Secondary | ICD-10-CM | POA: Diagnosis not present

## 2020-02-10 DIAGNOSIS — G35 Multiple sclerosis: Secondary | ICD-10-CM | POA: Diagnosis not present

## 2020-02-10 DIAGNOSIS — D500 Iron deficiency anemia secondary to blood loss (chronic): Secondary | ICD-10-CM | POA: Diagnosis not present

## 2020-02-10 DIAGNOSIS — E7849 Other hyperlipidemia: Secondary | ICD-10-CM | POA: Diagnosis not present

## 2020-02-10 DIAGNOSIS — I10 Essential (primary) hypertension: Secondary | ICD-10-CM | POA: Diagnosis not present

## 2020-04-27 ENCOUNTER — Ambulatory Visit: Payer: Medicare Other | Admitting: Urology

## 2020-04-27 DIAGNOSIS — N390 Urinary tract infection, site not specified: Secondary | ICD-10-CM

## 2020-05-15 ENCOUNTER — Ambulatory Visit: Payer: Medicare Other | Admitting: Urology

## 2020-05-15 DIAGNOSIS — N390 Urinary tract infection, site not specified: Secondary | ICD-10-CM

## 2021-12-11 DEATH — deceased
